# Patient Record
Sex: Female | Born: 1945 | Race: White | Hispanic: No | Smoking: Never smoker
Health system: Southern US, Community
[De-identification: ages and names within clinical notes are randomized; demographics above are authoritative.]

## PROBLEM LIST (undated history)

## (undated) DIAGNOSIS — J40 Bronchitis, not specified as acute or chronic: Secondary | ICD-10-CM

## (undated) DIAGNOSIS — J939 Pneumothorax, unspecified: Secondary | ICD-10-CM

## (undated) DIAGNOSIS — J189 Pneumonia, unspecified organism: Secondary | ICD-10-CM

## (undated) HISTORY — PX: EYE SURGERY: SHX253

---

## 2012-11-28 LAB — HM COLONOSCOPY

## 2017-11-07 DIAGNOSIS — H25011 Cortical age-related cataract, right eye: Secondary | ICD-10-CM | POA: Diagnosis not present

## 2017-11-07 DIAGNOSIS — H25043 Posterior subcapsular polar age-related cataract, bilateral: Secondary | ICD-10-CM | POA: Diagnosis not present

## 2017-11-07 DIAGNOSIS — H5213 Myopia, bilateral: Secondary | ICD-10-CM | POA: Diagnosis not present

## 2017-11-07 DIAGNOSIS — H2513 Age-related nuclear cataract, bilateral: Secondary | ICD-10-CM | POA: Diagnosis not present

## 2017-11-12 ENCOUNTER — Emergency Department (HOSPITAL_COMMUNITY): Payer: Medicare PPO

## 2017-11-12 ENCOUNTER — Emergency Department (HOSPITAL_COMMUNITY)
Admission: EM | Admit: 2017-11-12 | Discharge: 2017-11-12 | Disposition: A | Discharge status: Home / Self Care | Payer: Medicare PPO | Attending: Emergency Medicine | Admitting: Emergency Medicine

## 2017-11-12 ENCOUNTER — Encounter (HOSPITAL_COMMUNITY): Payer: Self-pay | Admitting: Emergency Medicine

## 2017-11-12 DIAGNOSIS — Z79899 Other long term (current) drug therapy: Secondary | ICD-10-CM | POA: Diagnosis not present

## 2017-11-12 DIAGNOSIS — R42 Dizziness and giddiness: Secondary | ICD-10-CM | POA: Insufficient documentation

## 2017-11-12 LAB — URINALYSIS, ROUTINE W REFLEX MICROSCOPIC
BILIRUBIN URINE: NEGATIVE
Bacteria, UA: NONE SEEN
Glucose, UA: NEGATIVE mg/dL
Ketones, ur: NEGATIVE mg/dL
Leukocytes, UA: NEGATIVE
Nitrite: NEGATIVE
PROTEIN: NEGATIVE mg/dL
SPECIFIC GRAVITY, URINE: 1.004 — AB (ref 1.005–1.030)
pH: 8 (ref 5.0–8.0)

## 2017-11-12 LAB — CBG MONITORING, ED: GLUCOSE-CAPILLARY: 104 mg/dL — AB (ref 70–99)

## 2017-11-12 LAB — BASIC METABOLIC PANEL
Anion gap: 9 (ref 5–15)
BUN: 19 mg/dL (ref 8–23)
CHLORIDE: 108 mmol/L (ref 98–111)
CO2: 22 mmol/L (ref 22–32)
CREATININE: 0.83 mg/dL (ref 0.44–1.00)
Calcium: 9.8 mg/dL (ref 8.9–10.3)
GFR calc Af Amer: >60 mL/min (ref >60)
GFR calc non Af Amer: >60 mL/min (ref >60)
GLUCOSE: 122 mg/dL — AB (ref 70–99)
Potassium: 3.6 mmol/L (ref 3.5–5.1)
SODIUM: 139 mmol/L (ref 135–145)

## 2017-11-12 LAB — CBC
HCT: 44.4 % (ref 36.0–46.0)
Hemoglobin: 15.1 g/dL — ABNORMAL HIGH (ref 12.0–15.0)
MCH: 32.2 pg (ref 26.0–34.0)
MCHC: 34 g/dL (ref 30.0–36.0)
MCV: 94.7 fL (ref 78.0–100.0)
PLATELETS: 293 10*3/uL (ref 150–400)
RBC: 4.69 MIL/uL (ref 3.87–5.11)
RDW: 13.5 % (ref 11.5–15.5)
WBC: 6.9 10*3/uL (ref 4.0–10.5)

## 2017-11-12 MED ORDER — DIAZEPAM 2 MG PO TABS
2.0000 mg | ORAL_TABLET | Freq: Once | ORAL | Status: AC
  Administered 2017-11-12: 2 mg via ORAL
  Filled 2017-11-12: qty 1

## 2017-11-12 MED ORDER — SODIUM CHLORIDE 0.9 % IV BOLUS
1000.0000 mL | Freq: Once | INTRAVENOUS | Status: AC
  Administered 2017-11-12: 1000 mL via INTRAVENOUS

## 2017-11-12 MED ORDER — MECLIZINE HCL 25 MG PO TABS
12.5000 mg | ORAL_TABLET | Freq: Once | ORAL | Status: AC
  Administered 2017-11-12: 12.5 mg via ORAL
  Filled 2017-11-12: qty 1

## 2017-11-12 MED ORDER — DIAZEPAM 2 MG PO TABS: 2.0000 mg | ORAL_TABLET | Freq: Two times a day (BID) | ORAL | 0 refills | Status: DC | PRN

## 2017-11-12 MED ORDER — MECLIZINE HCL 12.5 MG PO TABS: 12.5000 mg | ORAL_TABLET | Freq: Three times a day (TID) | ORAL | 0 refills | Status: DC | PRN

## 2017-11-12 NOTE — ED Notes (Signed)
ED Provider at bedside. 

## 2017-11-12 NOTE — ED Triage Notes (Signed)
Pt reports she is experiencing dizziness.  Pt states she feels very unsteady on her feet.  Pt reports last time she felt this way she needed 3 units of blood.  Pt reports emesis X1.  Pt took atrovent before coming to ED

## 2017-11-12 NOTE — ED Notes (Signed)
Pt ambulated throughout hallway without assistance. O2 saturation remained steady at 93%. Pt walked with slow shuffle steps, stated this was not normal for her as she normally walks quickly and several miles a day. No shortness of breath or dizziness noted.

## 2017-11-12 NOTE — Discharge Instructions (Addendum)
Take meclizine every 8 hours as needed for dizziness.  If you find this medication is not working, you can try taking Valium once every 12 hours.  Do not combine these medications and do not drive while taking them.  Do not combine them with any other sedating medications or alcohol.  You can attempt the Epley maneuver at home up to 3 times daily to help with your vertigo (see attached instructions).  Please follow-up with the ear nose and throat doctor, Dr. Annalee Genta, or neurology office for further evaluation and treatment of your vertigo.  Please follow-up and establish care with a primary care provider by calling the number circled on your discharge paperwork.  Please return to the emergency department immediately if you develop any new or worsening symptoms.

## 2017-11-12 NOTE — ED Provider Notes (Signed)
[facility] EMERGENCY DEPARTMENT Provider Note   CSN: [id number] Arrival date & time: 11/12/17  6948     History   Chief Complaint Chief Complaint  Patient presents with  . Dizziness    HPI Cynthia Simpson is a 72 y.o. female who is previously healthy presents with dizziness that began when she woke up this morning.  She reports she is feeling unsteady on her feet.  She has feels like the room is spinning and she feels like she is going to fall over.  She reports she is felt like this once or twice in the past and one time her hemoglobin was very low and she had a GI bleed.  She denies any blood noted in her stool.  She reports having emesis once this morning when she was dizzy.  She also had one loose stool.  She denies any vision changes, headache, chest pain, shortness of breath, abdominal pain, urinary symptoms.  Patient took meclizine without significant relief.  HPI  History reviewed. No pertinent past medical history.  There are no active problems to display for this patient.   History reviewed. No pertinent surgical history.   OB History   None      Home Medications    Prior to Admission medications   Medication Sig Start Date End Date Taking? Authorizing Provider  aspirin 325 MG tablet Take 325 mg by mouth every 6 (six) hours as needed for mild pain.   Yes [provider]  Cholecalciferol (VITAMIN D PO) Take 2 capsules by mouth daily.   Yes [provider]  Omega-3 Fatty Acids (OMEGA 3 PO) Take 2 capsules by mouth daily.   Yes [provider]  diazepam (VALIUM) 2 MG tablet Take 1 tablet (2 mg total) by mouth every 12 (twelve) hours as needed (dizziness). 11/12/17   Betheny Suchecki, Waylan Boga, PA-C  meclizine (ANTIVERT) 12.5 MG tablet Take 1 tablet (12.5 mg total) by mouth 3 (three) times daily as needed for dizziness. 11/12/17   Emi Holes, PA-C    Family History No family history on file.  Social History Social History    Tobacco Use  . Smoking status: Not on file  Substance Use Topics  . Alcohol use: Not on file  . Drug use: Not on file     Allergies   Morphine and related   Review of Systems Review of Systems  Constitutional: Negative for chills and fever.  HENT: Negative for facial swelling and sore throat.   Eyes: Negative for visual disturbance.  Respiratory: Negative for shortness of breath.   Cardiovascular: Negative for chest pain.  Gastrointestinal: Negative for abdominal pain, nausea and vomiting.  Genitourinary: Negative for dysuria.  Musculoskeletal: Negative for back pain.  Skin: Negative for rash and wound.  Neurological: Positive for dizziness. Negative for headaches.  Psychiatric/Behavioral: The patient is not nervous/anxious.      Physical Exam Updated Vital Signs BP 130/75   Pulse 81   Temp 97.9 F (36.6 C) (Oral)   Resp 16   Ht 5\' 6"  (1.676 m)   Wt 64.4 kg   SpO2 99%   BMI 22.92 kg/m   Physical Exam  Constitutional: She appears well-developed and well-nourished. No distress.  HENT:  Head: Normocephalic and atraumatic.  Mouth/Throat: Oropharynx is clear and moist. No oropharyngeal exudate.  Eyes: Pupils are equal, round, and reactive to light. Conjunctivae are normal. Right eye exhibits no discharge. Left eye exhibits no discharge. No scleral icterus.  Neck: Normal  range of motion. Neck supple. No thyromegaly present.  Cardiovascular: Normal rate, regular rhythm, normal heart sounds and intact distal pulses. Exam reveals no gallop and no friction rub.  No murmur heard. Pulmonary/Chest: Effort normal and breath sounds normal. No stridor. No respiratory distress. She has no wheezes. She has no rales.  Abdominal: Soft. Bowel sounds are normal. She exhibits no distension. There is no tenderness. There is no rebound and no guarding.  Musculoskeletal: She exhibits no edema.  Lymphadenopathy:    She has no cervical adenopathy.  Neurological: She is alert.  Coordination normal.  CN 3-12 intact; normal sensation throughout; 5/5 strength in all 4 extremities; equal bilateral grip strength; no ataxia on finger-to-nose; normal heel-to-shin test; patient feeling wobbly with Romberg  Skin: Skin is warm and dry. No rash noted. She is not diaphoretic. No pallor.  Psychiatric: She has a normal mood and affect.  Nursing note and vitals reviewed.    ED Treatments / Results  Labs (all labs ordered are listed, but only abnormal results are displayed) Labs Reviewed  BASIC METABOLIC PANEL - Abnormal; Notable for the following components:      Result Value   Glucose, Bld 122 (*)    All other components within normal limits  CBC - Abnormal; Notable for the following components:   Hemoglobin 15.1 (*)    All other components within normal limits  URINALYSIS, ROUTINE W REFLEX MICROSCOPIC - Abnormal; Notable for the following components:   Color, Urine STRAW (*)    Specific Gravity, Urine 1.004 (*)    Hgb urine dipstick SMALL (*)    All other components within normal limits  CBG MONITORING, ED - Abnormal; Notable for the following components:   Glucose-Capillary 104 (*)    All other components within normal limits    EKG None  Radiology Mr Brain Wo Contrast  Result Date: 11/12/2017 CLINICAL DATA:  Persistent central vertigo EXAM: MRI HEAD WITHOUT CONTRAST TECHNIQUE: Multiplanar, multiecho pulse sequences of the brain and surrounding structures were obtained without intravenous contrast. COMPARISON:  None. FINDINGS: Brain: Mild atrophy. Negative for acute infarct. Mild chronic white matter changes. Benign cyst left basal ganglia. Negative for hemorrhage mass or fluid collection. No midline shift. Vascular: Normal arterial flow voids Skull and upper cervical spine: Negative Sinuses/Orbits: Negative.  Scleral banding right globe. Other: None IMPRESSION: No acute abnormality. 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  Electronically Signed   By: Marlan Palau M.D.   On: 11/12/2017 08:39    Procedures Procedures (including critical care time)  Medications Ordered in ED Medications  sodium chloride 0.9 % bolus 1,000 mL (0 mLs Intravenous Stopped 11/12/17 0904)  diazepam (VALIUM) tablet 2 mg (2 mg Oral Given 11/12/17 0954)  meclizine (ANTIVERT) tablet 12.5 mg (12.5 mg Oral Given 11/12/17 1220)     Initial Impression / Assessment and Plan / ED Course  I have reviewed the triage vital signs and the nursing notes.  Pertinent labs & imaging results that were available during my care of the patient were reviewed by me and considered in my medical decision making (see chart for details).  Clinical Course as of Nov 13 1534  Wed Nov 12, 2017  1057 Patient MRI negative for posterior stroke.  Patient given Valium 2 mg is ambulating better, however she still feels unsteady.  Will give meclizine 12.5 mg.   [AL]    Clinical Course User Index [AL] Emi Holes, PA-C    Patient presenting with dizziness  starting this morning.  Labs are unremarkable except for mild elevation in hemoglobin, 15.1.  Patient given 1 L of fluids.  UA is negative for infection.  MRI is negative for acute abnormality.  Suspect peripheral cause.  Patient feeling improved after Valium 2 mg and meclizine in the ED.  Considering patient is unsure if meclizine she took at home helped, will discharge home with both, but patient stressed not to combine the medications and not to drive while taking either of them.  Patient also counseled on the Epley maneuver. She will be referred to ENT and neurology and follow-up and establish care with PCP.  Return precautions discussed.  Patient understands and agrees with plan.  Patient vitals stable throughout ED course and discharged in satisfactory condition. I discussed patient case with Dr. Patria Mane who guided the patient's management and agrees with plan.   Final Clinical Impressions(s) / ED Diagnoses    Final diagnoses:  Vertigo    ED Discharge Orders         Ordered    meclizine (ANTIVERT) 12.5 MG tablet  3 times daily PRN     11/12/17 1328    diazepam (VALIUM) 2 MG tablet  Every 12 hours PRN     11/12/17 [address], PA-C 11/12/17 1537    Azalia Bilis, MD 11/13/17 [phone]

## 2018-01-06 DIAGNOSIS — H2511 Age-related nuclear cataract, right eye: Secondary | ICD-10-CM | POA: Diagnosis not present

## 2018-01-06 DIAGNOSIS — H25811 Combined forms of age-related cataract, right eye: Secondary | ICD-10-CM | POA: Diagnosis not present

## 2018-01-06 DIAGNOSIS — H25041 Posterior subcapsular polar age-related cataract, right eye: Secondary | ICD-10-CM | POA: Diagnosis not present

## 2018-01-06 DIAGNOSIS — H25011 Cortical age-related cataract, right eye: Secondary | ICD-10-CM | POA: Diagnosis not present

## 2018-02-17 DIAGNOSIS — H25812 Combined forms of age-related cataract, left eye: Secondary | ICD-10-CM | POA: Diagnosis not present

## 2018-02-17 DIAGNOSIS — H25042 Posterior subcapsular polar age-related cataract, left eye: Secondary | ICD-10-CM | POA: Diagnosis not present

## 2018-02-17 DIAGNOSIS — H2512 Age-related nuclear cataract, left eye: Secondary | ICD-10-CM | POA: Diagnosis not present

## 2018-03-27 ENCOUNTER — Encounter (HOSPITAL_COMMUNITY): Payer: Self-pay

## 2018-03-27 ENCOUNTER — Ambulatory Visit (HOSPITAL_COMMUNITY)
Admission: EM | Admit: 2018-03-27 | Discharge: 2018-03-27 | Disposition: A | Discharge status: Home / Self Care | Payer: Medicare PPO | Attending: Internal Medicine | Admitting: Internal Medicine

## 2018-03-27 ENCOUNTER — Ambulatory Visit (INDEPENDENT_AMBULATORY_CARE_PROVIDER_SITE_OTHER): Payer: Medicare PPO

## 2018-03-27 DIAGNOSIS — J40 Bronchitis, not specified as acute or chronic: Secondary | ICD-10-CM

## 2018-03-27 DIAGNOSIS — J9811 Atelectasis: Secondary | ICD-10-CM

## 2018-03-27 DIAGNOSIS — R05 Cough: Secondary | ICD-10-CM

## 2018-03-27 HISTORY — DX: Pneumonia, unspecified organism: J18.9

## 2018-03-27 HISTORY — DX: Bronchitis, not specified as acute or chronic: J40

## 2018-03-27 HISTORY — DX: Pneumothorax, unspecified: J93.9

## 2018-03-27 MED ORDER — ALBUTEROL SULFATE HFA 108 (90 BASE) MCG/ACT IN AERS: 2.0000 | INHALATION_SPRAY | RESPIRATORY_TRACT | 0 refills | Status: DC | PRN

## 2018-03-27 MED ORDER — ALBUTEROL SULFATE (2.5 MG/3ML) 0.083% IN NEBU
2.5000 mg | INHALATION_SOLUTION | RESPIRATORY_TRACT | Status: AC
  Administered 2018-03-27: 2.5 mg via RESPIRATORY_TRACT

## 2018-03-27 MED ORDER — AZITHROMYCIN 250 MG PO TABS: ORAL_TABLET | ORAL | 0 refills | Status: DC

## 2018-03-27 MED ORDER — ALBUTEROL SULFATE (2.5 MG/3ML) 0.083% IN NEBU
INHALATION_SOLUTION | RESPIRATORY_TRACT | Status: AC
  Filled 2018-03-27: qty 3

## 2018-03-27 NOTE — ED Provider Notes (Signed)
[facility]    CSN: [id number] Arrival date & time: 03/27/18  [id number]     History   Chief Complaint Chief Complaint  Patient presents with  . URI    HPI [name] is a 73 y.o. female.   Onset of stuffy nose and cough x 8 days. Denies ST, HA, body aches. Has been feeling fatigued, but she does not feel bad. She has not had energy to do her usual walking and group exercises.  Felt a little more energetic yesterday am. She used to waiting to get better when she gets colds and has been trying to do that. She has been taking mucinex which cause sedation and lack of energy for days and d/c it. Then got Robitusin DM which did not help. Has been sleeping well, and cough is not keeping her awake. Cough is productive with white mucous. She has had bronchitis and pneumonia in the past and pneumothorax about 4 y ago. Denies long trips or edema. She is new to town and does not have a PCP yet.     Past Medical History:  Diagnosis Date  . Bronchitis    2-3 times in her life time  . Pneumonia    2016  . Pneumothorax    20+ years    There are no active problems to display for this patient.   Past Surgical History:  Procedure Laterality Date  . EYE SURGERY      OB History   No obstetric history on file.      Home Medications    Prior to Admission medications   Medication Sig Start Date End Date Taking? Authorizing Provider  albuterol (PROVENTIL HFA;VENTOLIN HFA) 108 (90 Base) MCG/ACT inhaler Inhale 2 puffs into the lungs every 4 (four) hours as needed for wheezing or shortness of breath (and cough x 5-7 days). 03/27/18   Rodriguez-Southworth, Nettie ElmSylvia, PA-C  aspirin 325 MG tablet Take 325 mg by mouth every 6 (six) hours as needed for mild pain.    [provider]  azithromycin (ZITHROMAX Z-PAK) 250 MG tablet TAKE 2 TODAY, THEN ONE QD X 4 DAYS 03/27/18   Rodriguez-Southworth, Nettie ElmSylvia, PA-C  Cholecalciferol (VITAMIN D PO) Take 2 capsules by mouth daily.     [provider]  diazepam (VALIUM) 2 MG tablet Take 1 tablet (2 mg total) by mouth every 12 (twelve) hours as needed (dizziness). 11/12/17   Law, Waylan BogaAlexandra M, PA-C  meclizine (ANTIVERT) 12.5 MG tablet Take 1 tablet (12.5 mg total) by mouth 3 (three) times daily as needed for dizziness. 11/12/17   Law, Waylan BogaAlexandra M, PA-C  Omega-3 Fatty Acids (OMEGA 3 PO) Take 2 capsules by mouth daily.    [provider]    Family History History reviewed. No pertinent family history.  Social History Social History   Tobacco Use  . Smoking status: Never Smoker  . Smokeless tobacco: Never Used  Substance Use Topics  . Alcohol use: Not on file  . Drug use: Not on file     Allergies   Levaquin [levofloxacin] and Morphine and related   Review of Systems Review of Systems  Constitutional: Positive for activity change, chills and fatigue. Negative for diaphoresis and fever.  HENT: Negative for congestion, ear discharge, ear pain, postnasal drip, rhinorrhea and sore throat.   Eyes: Negative for discharge.  Respiratory: Positive for cough. Negative for chest tightness, shortness of breath and wheezing.   Cardiovascular: Negative for chest pain, palpitations and leg swelling.  Gastrointestinal: Negative for  diarrhea, nausea and vomiting.  Genitourinary: Negative for difficulty urinating.  Musculoskeletal: Negative for myalgias.  Skin: Positive for rash.  Neurological: Negative for dizziness, weakness, light-headedness and headaches.  Hematological: Negative for adenopathy.    Physical Exam Triage Vital Signs ED Triage Vitals  Enc Vitals Group     BP 03/27/18 0824 135/79     Pulse Rate 03/27/18 0824 (!) 106     Resp 03/27/18 0824 16     Temp 03/27/18 0824 97.8 F (36.6 C)     Temp Source 03/27/18 0824 Oral     SpO2 03/27/18 0824 97 %     Weight --      Height --      Head Circumference --      Peak Flow --      Pain Score 03/27/18 0828 1     Pain Loc --      Pain Edu? --       Excl. in GC? --    No data found.  Updated Vital Signs BP 135/79 (BP Location: Left Arm)   Pulse (!) 106   Temp 97.8 F (36.6 C) (Oral)   Resp 16   SpO2 97%   Visual Acuity Right Eye Distance:   Left Eye Distance:   Bilateral Distance:    Right Eye Near:   Left Eye Near:    Bilateral Near:     Physical Exam Vitals signs and nursing note reviewed.  Constitutional:      General: She is not in acute distress.    Appearance: Normal appearance. She is not toxic-appearing.  Cardiovascular:     Rate and Rhythm: Normal rate and regular rhythm.     Heart sounds: No murmur.  Pulmonary:     Effort: Pulmonary effort is normal.     Breath sounds: Normal breath sounds. No wheezing, rhonchi or rales.     Comments: She coughed every time she exhaled, but this improved a  Little after the duo neb, but pulse ox went down to 89%. CXR ordered. After reviewing this and I re-checked her pulse ox and stayed around 95% Neurological:     Mental Status: She is alert.    UC Treatments / Results  Labs (all labs ordered are listed, but only abnormal results are displayed) Labs Reviewed - No data to display  EKG None  Radiology Dg Chest 2 View  Result Date: 03/27/2018 CLINICAL DATA:  Cough EXAM: CHEST - 2 VIEW COMPARISON:  None. FINDINGS: There is hyperinflation of the lungs compatible with COPD. Linear scarring or atelectasis in the lung bases. No effusions. Heart is normal size. No acute bony abnormality. IMPRESSION: COPD.  Bibasilar atelectasis or scarring. Electronically Signed   By: Charlett Nose M.D.   On: 03/27/2018 09:34   Procedures Procedures   Medications Ordered in UC Medications  albuterol (PROVENTIL) (2.5 MG/3ML) 0.083% nebulizer solution 2.5 mg (2.5 mg Nebulization Given 03/27/18 0901)    Initial Impression / Assessment and Plan / UC Course  I have reviewed the triage vital signs and the nursing notes.  Pertinent  imaging results that were available during my care of the  patient were reviewed by me and considered in my medical decision making (see chart for details). She coughed every time she exhaled, but this improved a  Little after the duo neb, but pulse ox went down to 89%. CXR ordered. After reviewing this and I re-checked her pulse ox and stayed around 95%. I decided to cover her for bronchitis  and placed her on Zpack and Albuterol inhaler. She will call [facility] office to get established with her.    Final Clinical Impressions(s) / UC Diagnoses   Final diagnoses:  Bronchitis   Discharge Instructions   None    ED Prescriptions    Medication Sig Dispense Auth. Provider   albuterol (PROVENTIL HFA;VENTOLIN HFA) 108 (90 Base) MCG/ACT inhaler Inhale 2 puffs into the lungs every 4 (four) hours as needed for wheezing or shortness of breath (and cough x 5-7 days). 1 Inhaler Rodriguez-Southworth, Leibish Mcgregor, PA-C   azithromycin (ZITHROMAX Z-PAK) 250 MG tablet TAKE 2 TODAY, THEN ONE QD X 4 DAYS 6 tablet Rodriguez-Southworth, Nettie Elm, PA-C     Controlled Substance Prescriptions [place] Controlled Substance Registry consulted?    Garey Ham, PA-C 03/27/18 2257

## 2018-03-27 NOTE — ED Triage Notes (Signed)
Pt presents with cold symptoms; nasal congestion, fatigue, and cough X 8 days.

## 2018-03-30 DIAGNOSIS — Z961 Presence of intraocular lens: Secondary | ICD-10-CM | POA: Diagnosis not present

## 2018-10-09 DIAGNOSIS — H547 Unspecified visual loss: Secondary | ICD-10-CM | POA: Diagnosis not present

## 2019-01-26 ENCOUNTER — Telehealth: Payer: Self-pay | Admitting: General Practice

## 2019-01-26 NOTE — Telephone Encounter (Signed)
Appointment scheduled.

## 2019-01-26 NOTE — Telephone Encounter (Signed)
Dr Ronnald Ramp, Would you be willing to see this patient to establish care? (see message below)  Please advise.

## 2019-01-26 NOTE — Telephone Encounter (Signed)
Yes, I will see her  TJ 

## 2019-01-26 NOTE — Telephone Encounter (Signed)
Pt called in to ask if provider Jones would take her on as a np? She said that he was suggested by 2 current patients, her son and daughter in law.   Adam and also Arta Bruce    CB: [phone]

## 2019-01-28 ENCOUNTER — Encounter: Payer: Self-pay | Admitting: Internal Medicine

## 2019-01-28 ENCOUNTER — Ambulatory Visit (INDEPENDENT_AMBULATORY_CARE_PROVIDER_SITE_OTHER): Payer: Medicare PPO | Admitting: Internal Medicine

## 2019-01-28 ENCOUNTER — Other Ambulatory Visit (INDEPENDENT_AMBULATORY_CARE_PROVIDER_SITE_OTHER): Payer: Medicare PPO

## 2019-01-28 ENCOUNTER — Ambulatory Visit (INDEPENDENT_AMBULATORY_CARE_PROVIDER_SITE_OTHER)
Admission: RE | Admit: 2019-01-28 | Discharge: 2019-01-28 | Disposition: A | Discharge status: Home / Self Care | Payer: Medicare PPO | Source: Ambulatory Visit | Attending: Internal Medicine | Admitting: Internal Medicine

## 2019-01-28 ENCOUNTER — Other Ambulatory Visit: Payer: Self-pay

## 2019-01-28 VITALS — BP 146/96 | HR 127 | Temp 98.8°F | Resp 16 | Ht 66.0 in | Wt 127.0 lb

## 2019-01-28 DIAGNOSIS — M79652 Pain in left thigh: Secondary | ICD-10-CM | POA: Diagnosis not present

## 2019-01-28 DIAGNOSIS — E538 Deficiency of other specified B group vitamins: Secondary | ICD-10-CM

## 2019-01-28 DIAGNOSIS — D539 Nutritional anemia, unspecified: Secondary | ICD-10-CM | POA: Diagnosis not present

## 2019-01-28 DIAGNOSIS — M79606 Pain in leg, unspecified: Secondary | ICD-10-CM

## 2019-01-28 DIAGNOSIS — M1711 Unilateral primary osteoarthritis, right knee: Secondary | ICD-10-CM | POA: Diagnosis not present

## 2019-01-28 DIAGNOSIS — M1712 Unilateral primary osteoarthritis, left knee: Secondary | ICD-10-CM | POA: Diagnosis not present

## 2019-01-28 DIAGNOSIS — R000 Tachycardia, unspecified: Secondary | ICD-10-CM | POA: Diagnosis not present

## 2019-01-28 DIAGNOSIS — M170 Bilateral primary osteoarthritis of knee: Secondary | ICD-10-CM

## 2019-01-28 DIAGNOSIS — I10 Essential (primary) hypertension: Secondary | ICD-10-CM

## 2019-01-28 DIAGNOSIS — R739 Hyperglycemia, unspecified: Secondary | ICD-10-CM | POA: Diagnosis not present

## 2019-01-28 DIAGNOSIS — G63 Polyneuropathy in diseases classified elsewhere: Secondary | ICD-10-CM | POA: Diagnosis not present

## 2019-01-28 LAB — HEPATIC FUNCTION PANEL
ALT: 14 U/L (ref 0–35)
AST: 15 U/L (ref 0–37)
Albumin: 4.4 g/dL (ref 3.5–5.2)
Alkaline Phosphatase: 65 U/L (ref 39–117)
Bilirubin, Direct: 0.1 mg/dL (ref 0.0–0.3)
Total Bilirubin: 0.6 mg/dL (ref 0.2–1.2)
Total Protein: 7.7 g/dL (ref 6.0–8.3)

## 2019-01-28 LAB — BASIC METABOLIC PANEL
BUN: 18 mg/dL (ref 6–23)
CO2: 26 mEq/L (ref 19–32)
Calcium: 9.7 mg/dL (ref 8.4–10.5)
Chloride: 102 mEq/L (ref 96–112)
Creatinine, Ser: 0.75 mg/dL (ref 0.40–1.20)
GFR: 75.29 mL/min (ref >60.00)
Glucose, Bld: 105 mg/dL — ABNORMAL HIGH (ref 70–99)
Potassium: 3.5 mEq/L (ref 3.5–5.1)
Sodium: 138 mEq/L (ref 135–145)

## 2019-01-28 LAB — IBC PANEL
Iron: 65 ug/dL (ref 42–145)
Saturation Ratios: 16 % — ABNORMAL LOW (ref 20.0–50.0)
Transferrin: 290 mg/dL (ref 212.0–360.0)

## 2019-01-28 LAB — CBC WITH DIFFERENTIAL/PLATELET
Basophils Absolute: 0.1 10*3/uL (ref 0.0–0.1)
Basophils Relative: 0.9 % (ref 0.0–3.0)
Eosinophils Absolute: 0.1 10*3/uL (ref 0.0–0.7)
Eosinophils Relative: 0.9 % (ref 0.0–5.0)
HCT: 42.7 % (ref 36.0–46.0)
Hemoglobin: 14.2 g/dL (ref 12.0–15.0)
Lymphocytes Relative: 21.7 % (ref 12.0–46.0)
Lymphs Abs: 2.4 10*3/uL (ref 0.7–4.0)
MCHC: 33.3 g/dL (ref 30.0–36.0)
MCV: 96.7 fl (ref 78.0–100.0)
Monocytes Absolute: 0.8 10*3/uL (ref 0.1–1.0)
Monocytes Relative: 7.4 % (ref 3.0–12.0)
Neutro Abs: 7.7 10*3/uL (ref 1.4–7.7)
Neutrophils Relative %: 69.1 % (ref 43.0–77.0)
Platelets: 288 10*3/uL (ref 150.0–400.0)
RBC: 4.41 Mil/uL (ref 3.87–5.11)
RDW: 13.6 % (ref 11.5–15.5)
WBC: 11.1 10*3/uL — ABNORMAL HIGH (ref 4.0–10.5)

## 2019-01-28 LAB — HEMOGLOBIN A1C: Hgb A1c MFr Bld: 5.4 % (ref 4.6–6.5)

## 2019-01-28 LAB — CK: Total CK: 55 U/L (ref 7–177)

## 2019-01-28 LAB — SEDIMENTATION RATE: Sed Rate: 13 mm/hr (ref 0–30)

## 2019-01-28 MED ORDER — TRAMADOL HCL 50 MG PO TABS: 50.0000 mg | ORAL_TABLET | Freq: Four times a day (QID) | ORAL | 1 refills | Status: AC | PRN

## 2019-01-28 NOTE — Patient Instructions (Signed)

## 2019-01-28 NOTE — Progress Notes (Signed)
Subjective:  Patient ID: Cynthia Simpson, female    DOB: 12-28-1945  Age: 73 y.o. MRN: [id number]  CC: Osteoarthritis   NEW TO ME  This visit occurred during the SARS-CoV-2 public health emergency.  Safety protocols were in place, including screening questions prior to the visit, additional usage of staff PPE, and extensive cleaning of exam room while observing appropriate contact time as indicated for disinfecting solutions.    HPI Cynthia Simpson presents for concerns about her lower extremities.  She tells me up until a week ago she was walking long distances each day.  She tells me over the last week she has developed stabbing pain diffusely in both lower extremities.  She says the pain is in the joints as well as the bones.  She has started using a 4 pronged walker because of the pain.  She denies claudication.  She denies trauma or injury.  She has tried to control the pain with Valium and aspirin to no avail.  She says her legs feel weak but she thinks this is caused by the pain.  She denies numbness or tingling in her lower extremities or feet.  She tells me that about a year and a half ago when she lived in [place] she was admitted for symptomatic anemia.  She tells me she had to receive a transfusion.  Outpatient Medications Prior to Visit  Medication Sig Dispense Refill   albuterol (PROVENTIL HFA;VENTOLIN HFA) 108 (90 Base) MCG/ACT inhaler Inhale 2 puffs into the lungs every 4 (four) hours as needed for wheezing or shortness of breath (and cough x 5-7 days). 1 Inhaler 0   Cholecalciferol (VITAMIN D PO) Take 2 capsules by mouth daily.     diazepam (VALIUM) 2 MG tablet Take 1 tablet (2 mg total) by mouth every 12 (twelve) hours as needed (dizziness). 5 tablet 0   Omega-3 Fatty Acids (OMEGA 3 PO) Take 2 capsules by mouth daily.     zinc gluconate 50 MG tablet Take 50 mg by mouth daily.     aspirin 325 MG tablet Take 325 mg by mouth every 6 (six) hours as needed for  mild pain.     azithromycin (ZITHROMAX Z-PAK) 250 MG tablet TAKE 2 TODAY, THEN ONE QD X 4 DAYS 6 tablet 0   meclizine (ANTIVERT) 12.5 MG tablet Take 1 tablet (12.5 mg total) by mouth 3 (three) times daily as needed for dizziness. 30 tablet 0   No facility-administered medications prior to visit.     ROS Review of Systems  Constitutional: Negative.  Negative for diaphoresis, fatigue and unexpected weight change.  HENT: Negative.   Eyes: Negative for visual disturbance.  Respiratory: Negative for cough, chest tightness, shortness of breath and wheezing.   Cardiovascular: Negative for chest pain, palpitations and leg swelling.  Gastrointestinal: Negative for abdominal pain, constipation, diarrhea, nausea and vomiting.  Endocrine: Negative.   Genitourinary: Negative.  Negative for difficulty urinating.  Musculoskeletal: Positive for arthralgias and gait problem. Negative for back pain and myalgias.  Skin: Negative for color change and pallor.  Neurological: Positive for weakness. Negative for dizziness, tremors, light-headedness, numbness and headaches.  Hematological: Negative for adenopathy. Does not bruise/bleed easily.  Psychiatric/Behavioral: Negative for behavioral problems. The patient is nervous/anxious.     Objective:  BP (!) 146/96 (BP Location: Left Arm, Patient Position: Sitting, Cuff Size: Normal)    Pulse (!) 127    Temp 98.8 F (37.1 C) (Oral)    Resp 16  Ht 5\' 6"  (1.676 m)    Wt 127 lb (57.6 kg)    SpO2 99%    BMI 20.50 kg/m   BP Readings from Last 3 Encounters:  01/28/19 (!) 146/96  03/27/18 135/79  11/12/17 130/75    Wt Readings from Last 3 Encounters:  01/28/19 127 lb (57.6 kg)  11/12/17 142 lb (64.4 kg)    Physical Exam Vitals signs reviewed.  HENT:     Nose: Nose normal.     Mouth/Throat:     Mouth: Mucous membranes are moist.  Eyes:     General: No scleral icterus.    Conjunctiva/sclera: Conjunctivae normal.  Neck:     Musculoskeletal: Neck  supple. No muscular tenderness.  Cardiovascular:     Rate and Rhythm: Regular rhythm. Tachycardia present.     Pulses:          Carotid pulses are 1+ on the right side and 1+ on the left side.      Radial pulses are 1+ on the right side and 1+ on the left side.       Femoral pulses are 1+ on the right side and 1+ on the left side.      Popliteal pulses are 1+ on the right side and 1+ on the left side.       Dorsalis pedis pulses are 1+ on the right side and 1+ on the left side.       Posterior tibial pulses are 1+ on the right side and 1+ on the left side.     Heart sounds: No murmur.     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   ABNORMAL  Pulmonary:     Effort: Pulmonary effort is normal.     Breath sounds: No stridor. No wheezing, rhonchi or rales.  Abdominal:     General: Abdomen is flat. Bowel sounds are normal. There is no distension.     Palpations: Abdomen is soft. There is no hepatomegaly, splenomegaly or mass.     Tenderness: There is no abdominal tenderness.  Musculoskeletal:     Right knee: She exhibits deformity (DJD). She exhibits normal range of motion, no swelling, no effusion, no erythema, normal alignment and no bony tenderness. No tenderness found.     Left knee: She exhibits deformity (DJD). She exhibits normal range of motion, no effusion, no erythema, normal alignment and no bony tenderness. No tenderness found.     Right foot: Normal range of motion.     Left foot: Normal range of motion.  Feet:     Right foot:     Skin integrity: No ulcer or skin breakdown.     Left foot:     Skin integrity: No ulcer or skin breakdown.  Lymphadenopathy:     Cervical: No cervical adenopathy.  Skin:    General: Skin is warm and dry.     Coloration: Skin is not pale.  Neurological:     Mental Status: She is alert.     Cranial Nerves: Cranial nerves are intact. No cranial nerve deficit or dysarthria.     Motor: Weakness present.     Coordination:  Coordination abnormal.     Deep Tendon Reflexes: Reflexes normal.     Reflex Scores:      Tricep reflexes are 0 on the right side and 0 on the left side.      Bicep reflexes are 0 on the right side and 0 on the left side.  Brachioradialis reflexes are 0 on the right side and 0 on the left side.      Patellar reflexes are 0 on the right side and 0 on the left side.      Achilles reflexes are 0 on the right side and 0 on the left side. Psychiatric:        Attention and Perception: Perception normal. She is inattentive.        Mood and Affect: Mood is anxious.        Speech: Speech is tangential. Speech is not rapid and pressured.        Behavior: Behavior normal. Behavior is cooperative.        Thought Content: Thought content normal.        Cognition and Memory: Cognition normal.        Judgment: Judgment normal.     Lab Results  Component Value Date   WBC 11.1 (H) 01/28/2019   HGB 14.2 01/28/2019   HCT 42.7 01/28/2019   PLT 288.0 01/28/2019   GLUCOSE 105 (H) 01/28/2019   ALT 14 01/28/2019   AST 15 01/28/2019   NA 138 01/28/2019   K 3.5 01/28/2019   CL 102 01/28/2019   CREATININE 0.75 01/28/2019   BUN 18 01/28/2019   CO2 26 01/28/2019   TSH 6.17 (H) 01/28/2019   HGBA1C 5.4 01/28/2019    Dg Chest 2 View  Result Date: 03/27/2018 CLINICAL DATA:  Cough EXAM: CHEST - 2 VIEW COMPARISON:  None. FINDINGS: There is hyperinflation of the lungs compatible with COPD. Linear scarring or atelectasis in the lung bases. No effusions. Heart is normal size. No acute bony abnormality. IMPRESSION: COPD.  Bibasilar atelectasis or scarring. Electronically Signed   By: Charlett Nose M.D.   On: 03/27/2018 09:34   Dg Tibia/fibula Left  Result Date: 01/28/2019 CLINICAL DATA:  Left leg pain.  No known injury. EXAM: LEFT TIBIA AND FIBULA - 2 VIEW COMPARISON:  None. FINDINGS: Slight joint space narrowing within the left knee joint. No acute bony abnormality. Specifically, no fracture, subluxation,  or dislocation. No joint effusion within the left knee. Soft tissues are intact. IMPRESSION: No acute bony abnormality. Electronically Signed   By: Charlett Nose M.D.   On: 01/28/2019 23:03   Dg Tibia/fibula Right  Result Date: 01/28/2019 CLINICAL DATA:  Right leg pain.  No known injury. EXAM: RIGHT TIBIA AND FIBULA - 2 VIEW COMPARISON:  None FINDINGS: Slight joint space narrowing within the right knee joint. No joint effusion. No acute bony abnormality. Specifically, no fracture, subluxation, or dislocation. Soft tissues intact. IMPRESSION: No acute bony abnormality. Electronically Signed   By: Charlett Nose M.D.   On: 01/28/2019 23:03   Dg Femur Min 2 Views Left  Result Date: 01/28/2019 CLINICAL DATA:  Left upper leg pain.  No known injury. EXAM: LEFT FEMUR 2 VIEWS COMPARISON:  None. FINDINGS: No acute bony abnormality. Specifically, no fracture, subluxation, or dislocation. Mild joint space narrowing within the left knee joint. No joint effusion. IMPRESSION: No acute bony abnormality. Mild degenerative changes in the left knee. Electronically Signed   By: Charlett Nose M.D.   On: 01/28/2019 23:02   Dg Femur, Min 2 Views Right  Result Date: 01/28/2019 CLINICAL DATA:  Right upper leg pain.  No known injury. EXAM: RIGHT FEMUR 2 VIEWS COMPARISON:  None. FINDINGS: No acute bony abnormality. Specifically, no fracture, subluxation, or dislocation. Joint space narrowing within the knee joint. No visible joint effusion. IMPRESSION: No acute bony abnormality. Mild  degenerative changes in the right knee joint. Electronically Signed   By: Charlett NoseKevin  Dover M.D.   On: 01/28/2019 23:02    Assessment & Plan:   Gardiner RamusLillian was seen today for osteoarthritis.  Diagnoses and all orders for this visit:  Tachycardia- She has mild asymptomatic tachycardia.  Her labs are negative for secondary causes.  This is likely caused by anxiety. -     EKG 12-Lead -     TSH; Future  Essential hypertension- Will discuss treatment  options during follow-up visits. -     EKG 12-Lead -     CBC with Differential; Future -     Basic metabolic panel; Future -     Hepatic function panel; Future -     Vitamin D 25 hydroxy; Future  Leg pain, diffuse, unspecified laterality- Based on her symptoms, exam, and plain films I think her symptoms are related to osteoarthritis.  I recommended that she try tramadol as needed for this. -     Sedimentation rate; Future -     CK (Creatine Kinase); Future -     DG FEMUR, MIN 2 VIEWS RIGHT; Future -     DG FEMUR MIN 2 VIEWS LEFT; Future -     DG Tibia/Fibula Left; Future -     DG Tibia/Fibula Right; Future  Deficiency anemia- Her H&H are normal now but she is deficient in vitamin B12. -     CBC with Differential; Future -     B12; Future -     IBC panel; Future -     Folate; Future -     Ferritin; Future -     Vitamin B1; Future  Hyperglycemia -     Hemoglobin A1c; Future  Primary osteoarthritis of both knees -     traMADol (ULTRAM) 50 MG tablet; Take 1 tablet (50 mg total) by mouth every 6 (six) hours as needed for up to 7 days for moderate pain.  Vitamin B12 deficiency neuropathy (HCC)- I recommended that she start parenteral B12 replacement therapy.   I have discontinued Gardiner RamusLillian T. Cai's aspirin, meclizine, and azithromycin. I am also having her start on traMADol. Additionally, I am having her maintain her Omega-3 Fatty Acids (OMEGA 3 PO), Cholecalciferol (VITAMIN D PO), diazepam, albuterol, and zinc gluconate.  Meds ordered this encounter  Medications   traMADol (ULTRAM) 50 MG tablet    Sig: Take 1 tablet (50 mg total) by mouth every 6 (six) hours as needed for up to 7 days for moderate pain.    Dispense:  90 tablet    Refill:  1     Follow-up: Return today (on 01/28/2019).  Sanda Lingerhomas Gudrun Axe, MD

## 2019-01-29 DIAGNOSIS — E538 Deficiency of other specified B group vitamins: Secondary | ICD-10-CM | POA: Insufficient documentation

## 2019-01-29 LAB — FERRITIN: Ferritin: 105.9 ng/mL (ref 10.0–291.0)

## 2019-01-29 LAB — VITAMIN B12: Vitamin B-12: 175 pg/mL — ABNORMAL LOW (ref 211–911)

## 2019-01-29 LAB — FOLATE: Folate: 12 ng/mL (ref >5.9)

## 2019-01-29 LAB — TSH: TSH: 6.17 u[IU]/mL — ABNORMAL HIGH (ref 0.35–4.50)

## 2019-01-29 LAB — VITAMIN D 25 HYDROXY (VIT D DEFICIENCY, FRACTURES): VITD: 86.93 ng/mL (ref 30.00–100.00)

## 2019-01-31 LAB — VITAMIN B1: Vitamin B1 (Thiamine): 13 nmol/L (ref 8–30)

## 2019-02-01 ENCOUNTER — Other Ambulatory Visit: Payer: Self-pay | Admitting: Internal Medicine

## 2019-02-01 ENCOUNTER — Telehealth: Payer: Self-pay | Admitting: Internal Medicine

## 2019-02-01 DIAGNOSIS — M170 Bilateral primary osteoarthritis of knee: Secondary | ICD-10-CM

## 2019-02-01 MED ORDER — MELOXICAM 7.5 MG PO TABS: 7.5000 mg | ORAL_TABLET | Freq: Every day | ORAL | 0 refills | Status: DC

## 2019-02-01 NOTE — Telephone Encounter (Signed)
I will send a referral to ortho and prescribe an nsaid  TJ

## 2019-02-01 NOTE — Telephone Encounter (Signed)
Pt contacted and informed of same. Pt stated understanding and will pick up the meloxicam. Pt stated that Ortho has already called to schedule.

## 2019-02-01 NOTE — Telephone Encounter (Signed)
Pt was seen Friday for her leg pain and was given tramadol.  She is a little better when sitting, but it is in a lot of pain when she stands up.  She wants to know what else she can do for it.  She also is asking whether her B12 deficiency has anything to do with the leg pain.  Please call [phone].  She is asking for Dr Ronnald Ramp specifically to call her back

## 2019-02-01 NOTE — Telephone Encounter (Signed)
Called patient: Moving and walking is still hurting and pt wanted to know if there was something else that she can do to help with the pain; ie: anti inflammatory medication or specialist.

## 2019-02-03 ENCOUNTER — Ambulatory Visit (INDEPENDENT_AMBULATORY_CARE_PROVIDER_SITE_OTHER): Payer: Medicare PPO

## 2019-02-03 ENCOUNTER — Other Ambulatory Visit: Payer: Self-pay

## 2019-02-03 DIAGNOSIS — E538 Deficiency of other specified B group vitamins: Secondary | ICD-10-CM

## 2019-02-03 MED ORDER — CYANOCOBALAMIN 1000 MCG/ML IJ SOLN
1000.0000 ug | Freq: Once | INTRAMUSCULAR | Status: AC
  Administered 2019-02-03: 1000 ug via INTRAMUSCULAR

## 2019-02-03 NOTE — Progress Notes (Signed)
I have reviewed and agree.

## 2019-02-08 ENCOUNTER — Ambulatory Visit (INDEPENDENT_AMBULATORY_CARE_PROVIDER_SITE_OTHER): Payer: Medicare PPO | Admitting: Internal Medicine

## 2019-02-08 ENCOUNTER — Encounter: Payer: Self-pay | Admitting: Internal Medicine

## 2019-02-08 ENCOUNTER — Other Ambulatory Visit: Payer: Self-pay

## 2019-02-08 VITALS — BP 140/88 | HR 90 | Temp 97.8°F | Resp 16 | Ht 66.0 in | Wt 123.8 lb

## 2019-02-08 DIAGNOSIS — Z23 Encounter for immunization: Secondary | ICD-10-CM

## 2019-02-08 DIAGNOSIS — M170 Bilateral primary osteoarthritis of knee: Secondary | ICD-10-CM

## 2019-02-08 DIAGNOSIS — G63 Polyneuropathy in diseases classified elsewhere: Secondary | ICD-10-CM

## 2019-02-08 DIAGNOSIS — E538 Deficiency of other specified B group vitamins: Secondary | ICD-10-CM | POA: Diagnosis not present

## 2019-02-08 DIAGNOSIS — I10 Essential (primary) hypertension: Secondary | ICD-10-CM | POA: Diagnosis not present

## 2019-02-08 MED ORDER — CYANOCOBALAMIN 1000 MCG/ML IJ SOLN
1000.0000 ug | Freq: Once | INTRAMUSCULAR | Status: AC
  Administered 2019-02-08: 09:00:00 1000 ug via INTRAMUSCULAR

## 2019-02-08 MED ORDER — SHINGRIX 50 MCG/0.5ML IM SUSR: 0.5000 mL | Freq: Once | INTRAMUSCULAR | 1 refills | Status: AC

## 2019-02-08 NOTE — Progress Notes (Signed)
Subjective:  Patient ID: Cynthia Simpson, female    DOB: 12/13/1945  Age: 73 y.o. MRN: [id number]  CC: Osteoarthritis  This visit occurred during the SARS-CoV-2 public health emergency.  Safety protocols were in place, including screening questions prior to the visit, additional usage of staff PPE, and extensive cleaning of exam room while observing appropriate contact time as indicated for disinfecting solutions.    HPI Cynthia Simpson presents for f/up - She tells me that her lower extremity pain is better with the combination of tramadol and meloxicam.  She continues to complain that her legs feel wobbly and she has tingling in her lower extremities.  Her recent labs were remarkable for B12 deficiency.  Outpatient Medications Prior to Visit  Medication Sig Dispense Refill  . albuterol (PROVENTIL HFA;VENTOLIN HFA) 108 (90 Base) MCG/ACT inhaler Inhale 2 puffs into the lungs every 4 (four) hours as needed for wheezing or shortness of breath (and cough x 5-7 days). 1 Inhaler 0  . Cholecalciferol (VITAMIN D PO) Take 2 capsules by mouth daily.    . diazepam (VALIUM) 2 MG tablet Take 1 tablet (2 mg total) by mouth every 12 (twelve) hours as needed (dizziness). 5 tablet 0  . meloxicam (MOBIC) 7.5 MG tablet Take 1 tablet (7.5 mg total) by mouth daily. 90 tablet 0  . Omega-3 Fatty Acids (OMEGA 3 PO) Take 2 capsules by mouth daily.    Marland Kitchen. zinc gluconate 50 MG tablet Take 50 mg by mouth daily.     No facility-administered medications prior to visit.     ROS Review of Systems  Constitutional: Negative.  Negative for diaphoresis and fatigue.  HENT: Negative.   Respiratory: Negative for cough, chest tightness, shortness of breath and wheezing.   Cardiovascular: Negative for chest pain, palpitations and leg swelling.  Gastrointestinal: Negative for abdominal pain, diarrhea, nausea and vomiting.  Endocrine: Negative.   Genitourinary: Negative.  Negative for difficulty urinating.   Musculoskeletal: Positive for arthralgias. Negative for back pain and myalgias.  Neurological: Negative for dizziness, weakness and numbness.  Hematological: Negative for adenopathy. Does not bruise/bleed easily.  Psychiatric/Behavioral: Negative.     Objective:  BP 140/88 (BP Location: Left Arm, Patient Position: Sitting, Cuff Size: Normal)   Pulse 90   Temp 97.8 F (36.6 C) (Oral)   Resp 16   Ht 5\' 6"  (1.676 m)   Wt 123 lb 12 oz (56.1 kg)   SpO2 98%   BMI 19.97 kg/m   BP Readings from Last 3 Encounters:  02/08/19 140/88  01/28/19 (!) 146/96  03/27/18 135/79    Wt Readings from Last 3 Encounters:  02/08/19 123 lb 12 oz (56.1 kg)  01/28/19 127 lb (57.6 kg)  11/12/17 142 lb (64.4 kg)    Physical Exam Vitals signs reviewed.  Constitutional:      Appearance: Normal appearance.  HENT:     Nose: Nose normal.     Mouth/Throat:     Mouth: Mucous membranes are moist.  Eyes:     General: No scleral icterus.    Conjunctiva/sclera: Conjunctivae normal.  Neck:     Musculoskeletal: Neck supple.  Cardiovascular:     Rate and Rhythm: Normal rate and regular rhythm.     Heart sounds: No murmur.  Pulmonary:     Effort: Pulmonary effort is normal.     Breath sounds: No stridor. No wheezing, rhonchi or rales.  Abdominal:     General: Abdomen is flat. Bowel sounds are normal. There is no  distension.     Palpations: Abdomen is soft. There is no hepatomegaly or splenomegaly.     Tenderness: There is no abdominal tenderness.  Musculoskeletal: Normal range of motion.     Right lower leg: No edema.     Left lower leg: No edema.  Lymphadenopathy:     Cervical: No cervical adenopathy.  Skin:    General: Skin is warm and dry.  Neurological:     General: No focal deficit present.     Mental Status: She is alert.  Psychiatric:        Mood and Affect: Mood normal.        Behavior: Behavior normal.     Lab Results  Component Value Date   WBC 11.1 (H) 01/28/2019   HGB 14.2  01/28/2019   HCT 42.7 01/28/2019   PLT 288.0 01/28/2019   GLUCOSE 105 (H) 01/28/2019   ALT 14 01/28/2019   AST 15 01/28/2019   NA 138 01/28/2019   K 3.5 01/28/2019   CL 102 01/28/2019   CREATININE 0.75 01/28/2019   BUN 18 01/28/2019   CO2 26 01/28/2019   TSH 6.17 (H) 01/28/2019   HGBA1C 5.4 01/28/2019    Dg Tibia/fibula Left  Result Date: 01/28/2019 CLINICAL DATA:  Left leg pain.  No known injury. EXAM: LEFT TIBIA AND FIBULA - 2 VIEW COMPARISON:  None. FINDINGS: Slight joint space narrowing within the left knee joint. No acute bony abnormality. Specifically, no fracture, subluxation, or dislocation. No joint effusion within the left knee. Soft tissues are intact. IMPRESSION: No acute bony abnormality. Electronically Signed   By: Charlett Nose M.D.   On: 01/28/2019 23:03   Dg Tibia/fibula Right  Result Date: 01/28/2019 CLINICAL DATA:  Right leg pain.  No known injury. EXAM: RIGHT TIBIA AND FIBULA - 2 VIEW COMPARISON:  None FINDINGS: Slight joint space narrowing within the right knee joint. No joint effusion. No acute bony abnormality. Specifically, no fracture, subluxation, or dislocation. Soft tissues intact. IMPRESSION: No acute bony abnormality. Electronically Signed   By: Charlett Nose M.D.   On: 01/28/2019 23:03   Dg Femur Min 2 Views Left  Result Date: 01/28/2019 CLINICAL DATA:  Left upper leg pain.  No known injury. EXAM: LEFT FEMUR 2 VIEWS COMPARISON:  None. FINDINGS: No acute bony abnormality. Specifically, no fracture, subluxation, or dislocation. Mild joint space narrowing within the left knee joint. No joint effusion. IMPRESSION: No acute bony abnormality. Mild degenerative changes in the left knee. Electronically Signed   By: Charlett Nose M.D.   On: 01/28/2019 23:02   Dg Femur, Min 2 Views Right  Result Date: 01/28/2019 CLINICAL DATA:  Right upper leg pain.  No known injury. EXAM: RIGHT FEMUR 2 VIEWS COMPARISON:  None. FINDINGS: No acute bony abnormality. Specifically, no  fracture, subluxation, or dislocation. Joint space narrowing within the knee joint. No visible joint effusion. IMPRESSION: No acute bony abnormality. Mild degenerative changes in the right knee joint. Electronically Signed   By: Charlett Nose M.D.   On: 01/28/2019 23:02    Assessment & Plan:   Cynthia Simpson was seen today for osteoarthritis.  Diagnoses and all orders for this visit:  Vitamin B12 deficiency neuropathy (HCC)- She will continue parenteral B12 replacement therapy. -     cyanocobalamin ((VITAMIN B-12)) injection 1,000 mcg  Primary osteoarthritis of both knees- She has had some improvement with the current meds.  She tells me she is seeing orthopedics within the next week or 2.  I have also recommended  that she try some physical therapy for this. -     Ambulatory referral to Physical Therapy  Essential hypertension- Her blood pressure is adequately well controlled.  Need for diphtheria-tetanus-pertussis (Tdap) vaccine -     Tdap vaccine greater than or equal to 7yo IM  Need for pneumococcal vaccination -     Pneumococcal polysaccharide vaccine 23-valent greater than or equal to 2yo subcutaneous/IM   I am having Judeth Porch T. Aure maintain her Omega-3 Fatty Acids (OMEGA 3 PO), Cholecalciferol (VITAMIN D PO), diazepam, albuterol, zinc gluconate, and meloxicam. We administered cyanocobalamin.  Meds ordered this encounter  Medications  . cyanocobalamin ((VITAMIN B-12)) injection 1,000 mcg     Follow-up: Return in about 4 months (around 06/08/2019).  Scarlette Calico, MD

## 2019-02-08 NOTE — Patient Instructions (Signed)
Vitamin B12 Deficiency Vitamin B12 deficiency occurs when the body does not have enough vitamin B12, which is an important vitamin. The body needs this vitamin:  To make red blood cells.  To make DNA. This is the genetic material inside cells.  To help the nerves work properly so they can carry messages from the brain to the body. Vitamin B12 deficiency can cause various health problems, such as a low red blood cell count (anemia) or nerve damage. What are the causes? This condition may be caused by:  Not eating enough foods that contain vitamin B12.  Not having enough stomach acid and digestive fluids to properly absorb vitamin B12 from the food that you eat.  Certain digestive system diseases that make it hard to absorb vitamin B12. These diseases include Crohn's disease, chronic pancreatitis, and cystic fibrosis.  A condition in which the body does not make enough of a protein (intrinsic factor), resulting in too few red blood cells (pernicious anemia).  Having a surgery in which part of the stomach or small intestine is removed.  Taking certain medicines that make it hard for the body to absorb vitamin B12. These medicines include: ? Heartburn medicines (antacids and proton pump inhibitors). ? Certain antibiotic medicines. ? Some medicines that are used to treat diabetes, tuberculosis, gout, or high cholesterol. What increases the risk? The following factors may make you more likely to develop a B12 deficiency:  Being older than age 50.  Eating a vegetarian or vegan diet, especially while you are pregnant.  Eating a poor diet while you are pregnant.  Taking certain medicines.  Having alcoholism. What are the signs or symptoms? In some cases, there are no symptoms of this condition. If the condition leads to anemia or nerve damage, various symptoms can occur, such as:  Weakness.  Fatigue.  Loss of appetite.  Weight loss.  Numbness or tingling in your hands and  feet.  Redness and burning of the tongue.  Confusion or memory problems.  Depression.  Sensory problems, such as color blindness, ringing in the ears, or loss of taste.  Diarrhea or constipation.  Trouble walking. If anemia is severe, symptoms can include:  Shortness of breath.  Dizziness.  Rapid heart rate (tachycardia). How is this diagnosed? This condition may be diagnosed with a blood test to measure the level of vitamin B12 in your blood. You may also have other tests, including:  A group of tests that measure certain characteristics of blood cells (complete blood count, CBC).  A blood test to measure intrinsic factor.  A procedure where a thin tube with a camera on the end is used to look into your stomach or intestines (endoscopy). Other tests may be needed to discover the cause of B12 deficiency. How is this treated? Treatment for this condition depends on the cause. This condition may be treated by:  Changing your eating and drinking habits, such as: ? Eating more foods that contain vitamin B12. ? Drinking less alcohol or no alcohol.  Getting vitamin B12 injections.  Taking vitamin B12 supplements. Your health care provider will tell you which dosage is best for you. Follow these instructions at home: Eating and drinking   Eat lots of healthy foods that contain vitamin B12, including: ? Meats and poultry. This includes beef, pork, chicken, turkey, and organ meats, such as liver. ? Seafood. This includes clams, rainbow trout, salmon, tuna, and haddock. ? Eggs. ? Cereal and dairy products that are fortified. This means that vitamin B12   has been added to the food. Check the label on the package to see if the food is fortified. The items listed above may not be a complete list of recommended foods and beverages. Contact a dietitian for more information. General instructions  Get any injections that are prescribed by your health care provider.  Take  supplements only as told by your health care provider. Follow the directions carefully.  Do not drink alcohol if your health care provider tells you not to. In some cases, you may only be asked to limit alcohol use.  Keep all follow-up visits as told by your health care provider. This is important. Contact a health care provider if:  Your symptoms come back. Get help right away if you:  Develop shortness of breath.  Have a rapid heart rate.  Have chest pain.  Become dizzy or lose consciousness. Summary  Vitamin B12 deficiency occurs when the body does not have enough vitamin B12.  The main causes of vitamin B12 deficiency include dietary deficiency, digestive diseases, pernicious anemia, and having a surgery in which part of the stomach or small intestine is removed.  In some cases, there are no symptoms of this condition. If the condition leads to anemia or nerve damage, various symptoms can occur, such as weakness, shortness of breath, and numbness.  Treatment may include getting vitamin B12 injections or taking vitamin B12 supplements. Eat lots of healthy foods that contain vitamin B12. This information is not intended to replace advice given to you by your health care provider. Make sure you discuss any questions you have with your health care provider. Document Released: 05/20/2011 Document Revised: 11/04/2017 Document Reviewed: 11/04/2017 Elsevier Patient Education  2020 [employer]  

## 2019-02-08 NOTE — Addendum Note (Signed)
Addended by: Janith Lima on: 02/08/2019 01:40 PM   Modules accepted: Orders

## 2019-02-09 ENCOUNTER — Ambulatory Visit: Payer: Medicare PPO | Admitting: Orthopaedic Surgery

## 2019-02-10 ENCOUNTER — Ambulatory Visit: Payer: Medicare PPO | Admitting: Internal Medicine

## 2019-02-15 ENCOUNTER — Other Ambulatory Visit: Payer: Self-pay

## 2019-02-15 ENCOUNTER — Telehealth: Payer: Self-pay

## 2019-02-15 ENCOUNTER — Ambulatory Visit (INDEPENDENT_AMBULATORY_CARE_PROVIDER_SITE_OTHER): Payer: Medicare PPO

## 2019-02-15 DIAGNOSIS — E538 Deficiency of other specified B group vitamins: Secondary | ICD-10-CM | POA: Diagnosis not present

## 2019-02-15 MED ORDER — CYANOCOBALAMIN 1000 MCG/ML IJ SOLN
1000.0000 ug | Freq: Once | INTRAMUSCULAR | Status: AC
  Administered 2019-02-15: 12:00:00 1000 ug via INTRAMUSCULAR

## 2019-02-15 NOTE — Telephone Encounter (Signed)
Patient has been on meloxicam for appx 2 weeks, and it is helping, she is better so far----the worst is first thing in the morning and then after she takes meloxicam, it is better through out the day----patient is wanting to know if she should take the full 90 day supply no matter how she is feeling, or should she stop taking it at some point if she feels totally better---patient is also not taking tramadol----she has a follow up appt with you in march, but will run out of meloxicam a little before that appt---in case you were re-evaluating the need for that medicine at that follow up visit-----please advise if you would like patient to take this medicine non stop or how should she manage it?  I will call patient back, thanks

## 2019-02-15 NOTE — Telephone Encounter (Signed)
I think she should take it every day for the next few months  TJ

## 2019-02-15 NOTE — Telephone Encounter (Signed)
Patient advised of dr Ronnald Ramp note/instructions

## 2019-02-15 NOTE — Progress Notes (Signed)
I have reviewed and agree.

## 2019-02-16 ENCOUNTER — Encounter: Payer: Self-pay | Admitting: Orthopaedic Surgery

## 2019-02-16 ENCOUNTER — Ambulatory Visit: Payer: Medicare PPO | Admitting: Orthopaedic Surgery

## 2019-02-16 DIAGNOSIS — M79606 Pain in leg, unspecified: Secondary | ICD-10-CM | POA: Diagnosis not present

## 2019-02-16 NOTE — Progress Notes (Signed)
Office Visit Note   Patient: Cynthia Simpson           Date of Birth: 12-09-1945           MRN: [id number] Visit Date: 02/16/2019              Requested by: Janith Lima, MD [address],  [place] [address] PCP: Janith Lima, MD   Assessment & Plan: Visit Diagnoses: No diagnosis found.  Plan: My impression is improving bilateral lower extremity pain of unclear etiology.  Overall her symptoms sound more related to her vitamin B12 deficiency.  I reviewed her x-rays and they were all normal for her age.  She will continue to take meloxicam as prescribed by her PCP.  I have given her some knee and hip strengthening exercises to do at home.  Questions encouraged and answered.  Follow-Up Instructions: Return if symptoms worsen or fail to improve.   Orders:  No orders of the defined types were placed in this encounter.  No orders of the defined types were placed in this encounter.     Procedures: No procedures performed   Clinical Data: No additional findings.   Subjective: Chief Complaint  Patient presents with  . Left Leg - Pain  . Right Leg - Pain    Cynthia Simpson is a very pleasant 73 year old female comes in for evaluation of intense pain that started 3 weeks ago when she would feel a stinging nerve pain in her legs randomly.  She is very active and does a lot of exercising.  She saw her PCP and was diagnosed with vitamin B 12 deficiency and she has been since getting replacement shots.  She is also been taking meloxicam daily which has helped with the pain.  She states that she is much better now.  She has not really have any pain in her legs or back.   Review of Systems  Constitutional: Negative.   HENT: Negative.   Eyes: Negative.   Respiratory: Negative.   Cardiovascular: Negative.   Endocrine: Negative.   Musculoskeletal: Negative.   Neurological: Negative.   Hematological: Negative.   Psychiatric/Behavioral: Negative.   All other  systems reviewed and are negative.    Objective: Vital Signs: There were no vitals taken for this visit.  Physical Exam Vitals signs and nursing note reviewed.  Constitutional:      Appearance: She is well-developed.  HENT:     Head: Normocephalic and atraumatic.  Neck:     Musculoskeletal: Neck supple.  Pulmonary:     Effort: Pulmonary effort is normal.  Abdominal:     Palpations: Abdomen is soft.  Skin:    General: Skin is warm.     Capillary Refill: Capillary refill takes less than 2 seconds.  Neurological:     Mental Status: She is alert and oriented to person, place, and time.  Psychiatric:        Behavior: Behavior normal.        Thought Content: Thought content normal.        Judgment: Judgment normal.     Ortho Exam Bilateral lower extremity exams are unremarkable.  There is no tenderness palpation there.  There is no swelling there is no bruising.  No focal motor or sensory deficits.  No neurovascular compromise. Specialty Comments:  No specialty comments available.  Imaging: No results found.   PMFS History: Patient Active Problem List   Diagnosis Date Noted  . Vitamin B12 deficiency  neuropathy (HCC) 01/29/2019  . Tachycardia 01/28/2019  . Essential hypertension 01/28/2019  . Leg pain, diffuse, unspecified laterality 01/28/2019  . Hyperglycemia 01/28/2019  . Primary osteoarthritis of both knees 01/28/2019   Past Medical History:  Diagnosis Date  . Bronchitis    2-3 times in her life time  . Pneumonia    2016  . Pneumothorax    20+ years    History reviewed. No pertinent family history.  Past Surgical History:  Procedure Laterality Date  . EYE SURGERY     Social History   Occupational History  . Not on file  Tobacco Use  . Smoking status: Never Smoker  . Smokeless tobacco: Never Used  Substance and Sexual Activity  . Alcohol use: Not on file  . Drug use: Not on file  . Sexual activity: Not on file

## 2019-03-09 ENCOUNTER — Telehealth: Payer: Self-pay | Admitting: Internal Medicine

## 2019-03-09 NOTE — Telephone Encounter (Signed)
ROI fax to Dr. Julianne Handler for patient records

## 2019-03-17 ENCOUNTER — Ambulatory Visit: Payer: Medicare PPO

## 2019-03-19 ENCOUNTER — Telehealth: Payer: Self-pay

## 2019-03-19 ENCOUNTER — Other Ambulatory Visit: Payer: Self-pay

## 2019-03-19 ENCOUNTER — Ambulatory Visit (INDEPENDENT_AMBULATORY_CARE_PROVIDER_SITE_OTHER): Payer: Medicare PPO

## 2019-03-19 DIAGNOSIS — E538 Deficiency of other specified B group vitamins: Secondary | ICD-10-CM | POA: Diagnosis not present

## 2019-03-19 MED ORDER — CYANOCOBALAMIN 1000 MCG/ML IJ SOLN
1000.0000 ug | Freq: Once | INTRAMUSCULAR | Status: AC
  Administered 2019-03-19: 1000 ug via INTRAMUSCULAR

## 2019-03-19 NOTE — Progress Notes (Signed)
Medical screening examination/treatment/procedure(s) were performed by non-physician practitioner and as supervising physician I was immediately available for consultation/collaboration. I agree with above. Brady Plant, MD   

## 2019-03-19 NOTE — Telephone Encounter (Signed)
Copied from CRM [id number]. Topic: Appointment Scheduling - Scheduling Inquiry for Clinic >> Mar 19, 2019  2:22 PM Cynthia Simpson wrote: Reason for CRM:   Pt wants to know when she needs to have labs prior to her appointment on 05/17/2019.

## 2019-03-20 NOTE — Telephone Encounter (Signed)
Contacted pt and informed that we will do blood work after pt sees Dr. Yetta Barre. Pt stated understanding and will keep the March appt.

## 2019-04-19 ENCOUNTER — Ambulatory Visit (INDEPENDENT_AMBULATORY_CARE_PROVIDER_SITE_OTHER): Payer: Medicare PPO | Admitting: *Deleted

## 2019-04-19 ENCOUNTER — Other Ambulatory Visit: Payer: Self-pay

## 2019-04-19 DIAGNOSIS — E538 Deficiency of other specified B group vitamins: Secondary | ICD-10-CM | POA: Diagnosis not present

## 2019-04-19 MED ORDER — CYANOCOBALAMIN 1000 MCG/ML IJ SOLN
1000.0000 ug | Freq: Once | INTRAMUSCULAR | Status: AC
  Administered 2019-04-19: 1000 ug via INTRAMUSCULAR

## 2019-04-19 NOTE — Progress Notes (Signed)
I have reviewed and agree.

## 2019-04-25 ENCOUNTER — Other Ambulatory Visit: Payer: Self-pay | Admitting: Internal Medicine

## 2019-04-25 DIAGNOSIS — M170 Bilateral primary osteoarthritis of knee: Secondary | ICD-10-CM

## 2019-05-03 ENCOUNTER — Telehealth: Payer: Self-pay | Admitting: Internal Medicine

## 2019-05-03 NOTE — Telephone Encounter (Signed)
Patient calling in regards to the meloxicam (MOBIC) 7.5 MG tablet and states pharmacy refilled medication. Patient would like to know if she should continue to take medication or wait until f/u visit on 03/08.

## 2019-05-03 NOTE — Telephone Encounter (Signed)
Contacted pt and she stated that she didn't know if PCP wanted her to continue. Informed that PCP agreed with her taking it or stopped as there is no need to taper. Pt stated that she wanted to stop it just to see how she feels.   Pt has a follow up with [place] in about 2 weeks and we will assess further if needed.

## 2019-05-17 ENCOUNTER — Ambulatory Visit: Payer: Medicare PPO | Admitting: Internal Medicine

## 2019-05-17 ENCOUNTER — Other Ambulatory Visit: Payer: Self-pay

## 2019-05-17 ENCOUNTER — Encounter: Payer: Self-pay | Admitting: Internal Medicine

## 2019-05-17 VITALS — BP 138/86 | HR 90 | Temp 97.9°F | Resp 16 | Ht 66.0 in | Wt 126.0 lb

## 2019-05-17 DIAGNOSIS — G63 Polyneuropathy in diseases classified elsewhere: Secondary | ICD-10-CM | POA: Diagnosis not present

## 2019-05-17 DIAGNOSIS — E538 Deficiency of other specified B group vitamins: Secondary | ICD-10-CM | POA: Diagnosis not present

## 2019-05-17 DIAGNOSIS — I739 Peripheral vascular disease, unspecified: Secondary | ICD-10-CM | POA: Insufficient documentation

## 2019-05-17 DIAGNOSIS — I7381 Erythromelalgia: Secondary | ICD-10-CM | POA: Insufficient documentation

## 2019-05-17 DIAGNOSIS — Z1159 Encounter for screening for other viral diseases: Secondary | ICD-10-CM

## 2019-05-17 DIAGNOSIS — E785 Hyperlipidemia, unspecified: Secondary | ICD-10-CM | POA: Insufficient documentation

## 2019-05-17 LAB — CBC WITH DIFFERENTIAL/PLATELET
Basophils Absolute: 0 10*3/uL (ref 0.0–0.1)
Basophils Relative: 0.5 % (ref 0.0–3.0)
Eosinophils Absolute: 0 10*3/uL (ref 0.0–0.7)
Eosinophils Relative: 0.7 % (ref 0.0–5.0)
HCT: 42.2 % (ref 36.0–46.0)
Hemoglobin: 14.5 g/dL (ref 12.0–15.0)
Lymphocytes Relative: 22.7 % (ref 12.0–46.0)
Lymphs Abs: 1.6 10*3/uL (ref 0.7–4.0)
MCHC: 34.4 g/dL (ref 30.0–36.0)
MCV: 95.6 fl (ref 78.0–100.0)
Monocytes Absolute: 0.4 10*3/uL (ref 0.1–1.0)
Monocytes Relative: 6.3 % (ref 3.0–12.0)
Neutro Abs: 5 10*3/uL (ref 1.4–7.7)
Neutrophils Relative %: 69.8 % (ref 43.0–77.0)
Platelets: 262 10*3/uL (ref 150.0–400.0)
RBC: 4.42 Mil/uL (ref 3.87–5.11)
RDW: 13.9 % (ref 11.5–15.5)
WBC: 7.1 10*3/uL (ref 4.0–10.5)

## 2019-05-17 LAB — LIPID PANEL
Cholesterol: 212 mg/dL — ABNORMAL HIGH (ref 0–200)
HDL: 76.1 mg/dL (ref >39.00)
LDL Cholesterol: 111 mg/dL — ABNORMAL HIGH (ref 0–99)
NonHDL: 135.47
Total CHOL/HDL Ratio: 3
Triglycerides: 122 mg/dL (ref 0.0–149.0)
VLDL: 24.4 mg/dL (ref 0.0–40.0)

## 2019-05-17 LAB — TSH: TSH: 3.23 u[IU]/mL (ref 0.35–4.50)

## 2019-05-17 MED ORDER — GABAPENTIN 100 MG PO CAPS: 100.0000 mg | ORAL_CAPSULE | Freq: Three times a day (TID) | ORAL | 3 refills | Status: DC

## 2019-05-17 MED ORDER — ROSUVASTATIN CALCIUM 5 MG PO TABS: 5.0000 mg | ORAL_TABLET | Freq: Every day | ORAL | 1 refills | Status: DC

## 2019-05-17 MED ORDER — CYANOCOBALAMIN 1000 MCG/ML IJ SOLN
1000.0000 ug | Freq: Once | INTRAMUSCULAR | Status: AC
  Administered 2019-05-17: 08:00:00 1000 ug via INTRAMUSCULAR

## 2019-05-17 NOTE — Patient Instructions (Signed)
Vitamin B12 Deficiency Vitamin B12 deficiency occurs when the body does not have enough vitamin B12, which is an important vitamin. The body needs this vitamin:  To make red blood cells.  To make DNA. This is the genetic material inside cells.  To help the nerves work properly so they can carry messages from the brain to the body. Vitamin B12 deficiency can cause various health problems, such as a low red blood cell count (anemia) or nerve damage. What are the causes? This condition may be caused by:  Not eating enough foods that contain vitamin B12.  Not having enough stomach acid and digestive fluids to properly absorb vitamin B12 from the food that you eat.  Certain digestive system diseases that make it hard to absorb vitamin B12. These diseases include Crohn's disease, chronic pancreatitis, and cystic fibrosis.  A condition in which the body does not make enough of a protein (intrinsic factor), resulting in too few red blood cells (pernicious anemia).  Having a surgery in which part of the stomach or small intestine is removed.  Taking certain medicines that make it hard for the body to absorb vitamin B12. These medicines include: ? Heartburn medicines (antacids and proton pump inhibitors). ? Certain antibiotic medicines. ? Some medicines that are used to treat diabetes, tuberculosis, gout, or high cholesterol. What increases the risk? The following factors may make you more likely to develop a B12 deficiency:  Being older than age 50.  Eating a vegetarian or vegan diet, especially while you are pregnant.  Eating a poor diet while you are pregnant.  Taking certain medicines.  Having alcoholism. What are the signs or symptoms? In some cases, there are no symptoms of this condition. If the condition leads to anemia or nerve damage, various symptoms can occur, such as:  Weakness.  Fatigue.  Loss of appetite.  Weight loss.  Numbness or tingling in your hands and  feet.  Redness and burning of the tongue.  Confusion or memory problems.  Depression.  Sensory problems, such as color blindness, ringing in the ears, or loss of taste.  Diarrhea or constipation.  Trouble walking. If anemia is severe, symptoms can include:  Shortness of breath.  Dizziness.  Rapid heart rate (tachycardia). How is this diagnosed? This condition may be diagnosed with a blood test to measure the level of vitamin B12 in your blood. You may also have other tests, including:  A group of tests that measure certain characteristics of blood cells (complete blood count, CBC).  A blood test to measure intrinsic factor.  A procedure where a thin tube with a camera on the end is used to look into your stomach or intestines (endoscopy). Other tests may be needed to discover the cause of B12 deficiency. How is this treated? Treatment for this condition depends on the cause. This condition may be treated by:  Changing your eating and drinking habits, such as: ? Eating more foods that contain vitamin B12. ? Drinking less alcohol or no alcohol.  Getting vitamin B12 injections.  Taking vitamin B12 supplements. Your health care provider will tell you which dosage is best for you. Follow these instructions at home: Eating and drinking   Eat lots of healthy foods that contain vitamin B12, including: ? Meats and poultry. This includes beef, pork, chicken, turkey, and organ meats, such as liver. ? Seafood. This includes clams, rainbow trout, salmon, tuna, and haddock. ? Eggs. ? Cereal and dairy products that are fortified. This means that vitamin B12   has been added to the food. Check the label on the package to see if the food is fortified. The items listed above may not be a complete list of recommended foods and beverages. Contact a dietitian for more information. General instructions  Get any injections that are prescribed by your health care provider.  Take  supplements only as told by your health care provider. Follow the directions carefully.  Do not drink alcohol if your health care provider tells you not to. In some cases, you may only be asked to limit alcohol use.  Keep all follow-up visits as told by your health care provider. This is important. Contact a health care provider if:  Your symptoms come back. Get help right away if you:  Develop shortness of breath.  Have a rapid heart rate.  Have chest pain.  Become dizzy or lose consciousness. Summary  Vitamin B12 deficiency occurs when the body does not have enough vitamin B12.  The main causes of vitamin B12 deficiency include dietary deficiency, digestive diseases, pernicious anemia, and having a surgery in which part of the stomach or small intestine is removed.  In some cases, there are no symptoms of this condition. If the condition leads to anemia or nerve damage, various symptoms can occur, such as weakness, shortness of breath, and numbness.  Treatment may include getting vitamin B12 injections or taking vitamin B12 supplements. Eat lots of healthy foods that contain vitamin B12. This information is not intended to replace advice given to you by your health care provider. Make sure you discuss any questions you have with your health care provider. Document Revised: 08/14/2018 Document Reviewed: 11/04/2017 Elsevier Patient Education  2020 [employer]  

## 2019-05-17 NOTE — Progress Notes (Signed)
Subjective:  Patient ID: Cynthia Simpson, female    DOB: Jul 29, 1945  Age: 74 y.o. MRN: [id number]  CC: Osteoarthritis  This visit occurred during the SARS-CoV-2 public health emergency.  Safety protocols were in place, including screening questions prior to the visit, additional usage of staff PPE, and extensive cleaning of exam room while observing appropriate contact time as indicated for disinfecting solutions.    HPI PURVA VESSELL presents for f/up - She complains that over the last 6 weeks she has developed a red, painful lesions on the top of her toes.  She continues to describe a compulsive walking regimen.  She thinks she injured her toes on the top of her shoes.  She continues to complain of lower extremity discomfort.  She has arthralgias but also says sometimes she feels like there is a vice clamping on her lower extremities with activity.  She has been seen by orthopedics.   Outpatient Medications Prior to Visit  Medication Sig Dispense Refill  . albuterol (PROVENTIL HFA;VENTOLIN HFA) 108 (90 Base) MCG/ACT inhaler Inhale 2 puffs into the lungs every 4 (four) hours as needed for wheezing or shortness of breath (and cough x 5-7 days). 1 Inhaler 0  . Cholecalciferol (VITAMIN D PO) Take 2 capsules by mouth daily.    . diazepam (VALIUM) 2 MG tablet Take 1 tablet (2 mg total) by mouth every 12 (twelve) hours as needed (dizziness). 5 tablet 0  . meloxicam (MOBIC) 7.5 MG tablet TAKE 1 TABLET BY MOUTH EVERY DAY 90 tablet 0  . Omega-3 Fatty Acids (OMEGA 3 PO) Take 2 capsules by mouth daily.    Marland Kitchen zinc gluconate 50 MG tablet Take 50 mg by mouth daily.     No facility-administered medications prior to visit.    ROS Review of Systems  Constitutional: Negative for chills, diaphoresis, fatigue and fever.  HENT: Negative.   Eyes: Negative.   Respiratory: Negative for cough, chest tightness, shortness of breath and wheezing.   Cardiovascular: Negative for chest pain,  palpitations and leg swelling.  Gastrointestinal: Negative for abdominal pain, diarrhea, nausea and vomiting.  Endocrine: Negative.   Genitourinary: Negative.  Negative for difficulty urinating.  Musculoskeletal: Positive for arthralgias. Negative for back pain and myalgias.  Skin: Positive for color change. Negative for wound.  Neurological: Negative.  Negative for dizziness, weakness, light-headedness, numbness and headaches.  Hematological: Negative for adenopathy. Does not bruise/bleed easily.  Psychiatric/Behavioral: Negative.     Objective:  BP 138/86   Pulse 90   Temp 97.9 F (36.6 C) (Oral)   Resp 16   Ht 5\' 6"  (1.676 m)   Wt 126 lb (57.2 kg)   SpO2 99%   BMI 20.34 kg/m   BP Readings from Last 3 Encounters:  05/17/19 138/86  02/08/19 140/88  01/28/19 (!) 146/96    Wt Readings from Last 3 Encounters:  05/17/19 126 lb (57.2 kg)  02/08/19 123 lb 12 oz (56.1 kg)  01/28/19 127 lb (57.6 kg)    Physical Exam Vitals reviewed.  HENT:     Nose: Nose normal.     Mouth/Throat:     Mouth: Mucous membranes are moist.  Eyes:     General: No scleral icterus.    Conjunctiva/sclera: Conjunctivae normal.  Cardiovascular:     Rate and Rhythm: Normal rate and regular rhythm.     Pulses:          Dorsalis pedis pulses are 0 on the right side and 0  on the left side.       Posterior tibial pulses are 1+ on the right side and 1+ on the left side.     Heart sounds: No murmur.  Pulmonary:     Effort: Pulmonary effort is normal.     Breath sounds: No stridor. No wheezing, rhonchi or rales.  Abdominal:     General: Abdomen is flat.     Palpations: There is no mass.     Tenderness: There is no abdominal tenderness. There is no guarding.  Musculoskeletal:        General: Normal range of motion.     Cervical back: Neck supple.     Right lower leg: No edema.     Left lower leg: No edema.  Feet:     Right foot:     Skin integrity: Erythema present. No ulcer, blister, skin  breakdown, warmth or dry skin.     Toenail Condition: Right toenails are normal.     Left foot:     Skin integrity: Erythema present. No ulcer, blister, skin breakdown, warmth or dry skin.     Toenail Condition: Left toenails are normal.     Comments: On the dorsum of both toes, more prominently on the left than the right, there are areas of well-demarcated erythema with no ulcers or breakdown.  See photo. Lymphadenopathy:     Cervical: No cervical adenopathy.  Skin:    General: Skin is warm and dry.     Coloration: Skin is not pale.     Findings: Erythema present.  Neurological:     General: No focal deficit present.     Lab Results  Component Value Date   WBC 7.1 05/17/2019   HGB 14.5 05/17/2019   HCT 42.2 05/17/2019   PLT 262.0 05/17/2019   GLUCOSE 105 (H) 01/28/2019   CHOL 212 (H) 05/17/2019   TRIG 122.0 05/17/2019   HDL 76.10 05/17/2019   LDLCALC 111 (H) 05/17/2019   ALT 14 01/28/2019   AST 15 01/28/2019   NA 138 01/28/2019   K 3.5 01/28/2019   CL 102 01/28/2019   CREATININE 0.75 01/28/2019   BUN 18 01/28/2019   CO2 26 01/28/2019   TSH 3.23 05/17/2019   HGBA1C 5.4 01/28/2019    DG Tibia/Fibula Left  Result Date: 01/28/2019 CLINICAL DATA:  Left leg pain.  No known injury. EXAM: LEFT TIBIA AND FIBULA - 2 VIEW COMPARISON:  None. FINDINGS: Slight joint space narrowing within the left knee joint. No acute bony abnormality. Specifically, no fracture, subluxation, or dislocation. No joint effusion within the left knee. Soft tissues are intact. IMPRESSION: No acute bony abnormality. Electronically Signed   By: Charlett Nose M.D.   On: 01/28/2019 23:03   DG Tibia/Fibula Right  Result Date: 01/28/2019 CLINICAL DATA:  Right leg pain.  No known injury. EXAM: RIGHT TIBIA AND FIBULA - 2 VIEW COMPARISON:  None FINDINGS: Slight joint space narrowing within the right knee joint. No joint effusion. No acute bony abnormality. Specifically, no fracture, subluxation, or dislocation.  Soft tissues intact. IMPRESSION: No acute bony abnormality. Electronically Signed   By: Charlett Nose M.D.   On: 01/28/2019 23:03   DG FEMUR MIN 2 VIEWS LEFT  Result Date: 01/28/2019 CLINICAL DATA:  Left upper leg pain.  No known injury. EXAM: LEFT FEMUR 2 VIEWS COMPARISON:  None. FINDINGS: No acute bony abnormality. Specifically, no fracture, subluxation, or dislocation. Mild joint space narrowing within the left knee joint. No joint effusion. IMPRESSION: No  acute bony abnormality. Mild degenerative changes in the left knee. Electronically Signed   By: Rolm Baptise M.D.   On: 01/28/2019 23:02   DG FEMUR, MIN 2 VIEWS RIGHT  Result Date: 01/28/2019 CLINICAL DATA:  Right upper leg pain.  No known injury. EXAM: RIGHT FEMUR 2 VIEWS COMPARISON:  None. FINDINGS: No acute bony abnormality. Specifically, no fracture, subluxation, or dislocation. Joint space narrowing within the knee joint. No visible joint effusion. IMPRESSION: No acute bony abnormality. Mild degenerative changes in the right knee joint. Electronically Signed   By: Rolm Baptise M.D.   On: 01/28/2019 23:02    Assessment & Plan:   Taysia was seen today for osteoarthritis.  Diagnoses and all orders for this visit:  Vitamin B12 deficiency neuropathy ([place])- I have asked her to continue to receive B12 injections every month or 2. -     cyanocobalamin ((VITAMIN B-12)) injection 1,000 mcg -     CBC with Differential/Platelet  Claudication of both lower extremities ([place])- She has lower extremity symptoms and decreased pulses.  I recommended that she undergo arterial flow studies to screen for PAD. 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 -     gabapentin (NEURONTIN) 100 MG capsule; Take 1 capsule (100 mg total) by mouth 3 (three) times daily.  Hyperlipidemia LDL goal <130- She  has a mildly elevated ASCVD risk score.  I have asked her to take a statin for CV risk reduction. -     Lipid panel -     TSH -     rosuvastatin (CRESTOR) 5 MG tablet; Take 1 tablet (5 mg total) by mouth daily.  Need for hepatitis C screening test -     Hepatitis C antibody   I am having Judeth Porch T. Swayne start on gabapentin and rosuvastatin. I am also having her maintain her Omega-3 Fatty Acids (OMEGA 3 PO), Cholecalciferol (VITAMIN D PO), diazepam, albuterol, zinc gluconate, and meloxicam. We administered cyanocobalamin.  Meds ordered this encounter  Medications  . cyanocobalamin ((VITAMIN B-12)) injection 1,000 mcg  . gabapentin (NEURONTIN) 100 MG capsule    Sig: Take 1 capsule (100 mg total) by mouth 3 (three) times daily.    Dispense:  90 capsule    Refill:  3  . rosuvastatin (CRESTOR) 5 MG tablet    Sig: Take 1 tablet (5 mg total) by mouth daily.    Dispense:  90 tablet    Refill:  1     Follow-up: Return in about 3 months (around 08/17/2019).  Scarlette Calico, MD

## 2019-05-18 ENCOUNTER — Telehealth: Payer: Self-pay

## 2019-05-18 ENCOUNTER — Encounter: Payer: Self-pay | Admitting: Internal Medicine

## 2019-05-18 DIAGNOSIS — I7381 Erythromelalgia: Secondary | ICD-10-CM

## 2019-05-18 LAB — HEPATITIS C ANTIBODY
Hepatitis C Ab: NONREACTIVE
SIGNAL TO CUT-OFF: 0.01 (ref <1.00)

## 2019-05-18 NOTE — Telephone Encounter (Signed)
  New message   The patient was seen yesterday, was told gabapentin (NEURONTIN) 100 MG capsule would be called in. Her son went to pick up medication and was given a statin medication.   The patient called [facility] this morning they do not have a prescription for her.   Please advise.    [address] AT [facility] [address]  [address] [place] [address]  Phone:  [phone] Fax:  [phone]

## 2019-05-18 NOTE — Telephone Encounter (Signed)
Informed pt that rx was sent. Pt stated that she will call the pharmacy and have them get it ready for her.

## 2019-05-19 MED ORDER — GABAPENTIN 100 MG PO CAPS: 100.0000 mg | ORAL_CAPSULE | Freq: Three times a day (TID) | ORAL | 3 refills | Status: DC

## 2019-05-19 NOTE — Addendum Note (Signed)
Addended by: Verlan Friends on: 05/19/2019 10:32 AM   Modules accepted: Orders

## 2019-05-19 NOTE — Telephone Encounter (Signed)
New message:   Pt is calling and ask can we please send over the refill request again for gabapentin (NEURONTIN) 100 MG capsule due to the pharmacy not receiving the order just yet. Pt also ask can we make it a easy open capsule. Please advise.

## 2019-05-19 NOTE — Telephone Encounter (Signed)
erx sent per patient request.   There is no option for an easy open capsule.

## 2019-05-27 ENCOUNTER — Telehealth (HOSPITAL_COMMUNITY): Payer: Self-pay

## 2019-05-27 NOTE — Telephone Encounter (Signed)

## 2019-05-28 ENCOUNTER — Other Ambulatory Visit: Payer: Self-pay

## 2019-05-28 ENCOUNTER — Ambulatory Visit (HOSPITAL_COMMUNITY)
Admission: RE | Admit: 2019-05-28 | Discharge: 2019-05-28 | Disposition: A | Discharge status: Home / Self Care | Payer: Medicare PPO | Source: Ambulatory Visit | Attending: Internal Medicine | Admitting: Internal Medicine

## 2019-05-28 ENCOUNTER — Encounter: Payer: Self-pay | Admitting: Internal Medicine

## 2019-05-28 DIAGNOSIS — I739 Peripheral vascular disease, unspecified: Secondary | ICD-10-CM

## 2019-06-08 ENCOUNTER — Ambulatory Visit: Payer: Medicare PPO | Admitting: Internal Medicine

## 2019-06-18 ENCOUNTER — Ambulatory Visit (INDEPENDENT_AMBULATORY_CARE_PROVIDER_SITE_OTHER): Payer: Medicare PPO | Admitting: *Deleted

## 2019-06-18 ENCOUNTER — Other Ambulatory Visit: Payer: Self-pay

## 2019-06-18 ENCOUNTER — Telehealth: Payer: Self-pay | Admitting: *Deleted

## 2019-06-18 DIAGNOSIS — E538 Deficiency of other specified B group vitamins: Secondary | ICD-10-CM

## 2019-06-18 MED ORDER — CYANOCOBALAMIN 1000 MCG/ML IJ SOLN
1000.0000 ug | Freq: Once | INTRAMUSCULAR | Status: AC
  Administered 2019-06-18: 1000 ug via INTRAMUSCULAR

## 2019-06-18 NOTE — Telephone Encounter (Signed)
Pt came in for her B12 injection. She states she was not told how long she need to take injection. Inform Pt MD is out of the office today, but will send msg and call her back w/his response once he review msg.Marland KitchenRaechel Chute

## 2019-06-18 NOTE — Telephone Encounter (Signed)
Pt sent msg back stating "  Monthly for the rest of her life    Called pt to inform her what MD stated.Marland KitchenRaechel Chute

## 2019-06-18 NOTE — Progress Notes (Signed)
MD is out of the office today can u cosign for B12 inj.Marland KitchenRaechel Chute

## 2019-06-18 NOTE — Progress Notes (Addendum)
Pls cosign for B12 inj since PCP is out of the office today.Marland KitchenRaechel Chute I have reviewed and agree

## 2019-07-08 DIAGNOSIS — H26493 Other secondary cataract, bilateral: Secondary | ICD-10-CM | POA: Diagnosis not present

## 2019-07-08 DIAGNOSIS — H52203 Unspecified astigmatism, bilateral: Secondary | ICD-10-CM | POA: Diagnosis not present

## 2019-07-08 DIAGNOSIS — H5213 Myopia, bilateral: Secondary | ICD-10-CM | POA: Diagnosis not present

## 2019-07-08 DIAGNOSIS — Z961 Presence of intraocular lens: Secondary | ICD-10-CM | POA: Diagnosis not present

## 2019-07-19 ENCOUNTER — Other Ambulatory Visit: Payer: Self-pay | Admitting: *Deleted

## 2019-07-19 ENCOUNTER — Ambulatory Visit (INDEPENDENT_AMBULATORY_CARE_PROVIDER_SITE_OTHER): Payer: Medicare PPO | Admitting: *Deleted

## 2019-07-19 ENCOUNTER — Other Ambulatory Visit: Payer: Self-pay

## 2019-07-19 DIAGNOSIS — I7381 Erythromelalgia: Secondary | ICD-10-CM

## 2019-07-19 DIAGNOSIS — E538 Deficiency of other specified B group vitamins: Secondary | ICD-10-CM

## 2019-07-19 MED ORDER — CYANOCOBALAMIN 1000 MCG/ML IJ SOLN
1000.0000 ug | Freq: Once | INTRAMUSCULAR | Status: AC
  Administered 2019-07-19: 1000 ug via INTRAMUSCULAR

## 2019-07-19 MED ORDER — GABAPENTIN 100 MG PO CAPS: 100.0000 mg | ORAL_CAPSULE | Freq: Three times a day (TID) | ORAL | 0 refills | Status: DC

## 2019-07-19 NOTE — Progress Notes (Signed)
Pls cosign for B12 inj since PCP is out of the office today../lmb 

## 2019-07-19 NOTE — Telephone Encounter (Signed)
Pt called requesting refill on her Gabapentin 100 mg take 1 three times a day. Reviewed chart pt is up-to-date sent refills to [facility].Marland KitchenRaechel Chute

## 2019-07-20 DIAGNOSIS — H26491 Other secondary cataract, right eye: Secondary | ICD-10-CM | POA: Diagnosis not present

## 2019-07-22 ENCOUNTER — Other Ambulatory Visit: Payer: Self-pay | Admitting: Internal Medicine

## 2019-07-22 DIAGNOSIS — M170 Bilateral primary osteoarthritis of knee: Secondary | ICD-10-CM

## 2019-07-27 DIAGNOSIS — H26492 Other secondary cataract, left eye: Secondary | ICD-10-CM | POA: Diagnosis not present

## 2019-08-20 ENCOUNTER — Other Ambulatory Visit: Payer: Self-pay

## 2019-08-20 ENCOUNTER — Ambulatory Visit (INDEPENDENT_AMBULATORY_CARE_PROVIDER_SITE_OTHER): Payer: Medicare PPO | Admitting: *Deleted

## 2019-08-20 DIAGNOSIS — E538 Deficiency of other specified B group vitamins: Secondary | ICD-10-CM | POA: Diagnosis not present

## 2019-08-20 MED ORDER — CYANOCOBALAMIN 1000 MCG/ML IJ SOLN
1000.0000 ug | Freq: Once | INTRAMUSCULAR | Status: AC
  Administered 2019-08-20: 1000 ug via INTRAMUSCULAR

## 2019-08-20 NOTE — Progress Notes (Addendum)
I have reviewed and agree  Pls cosign for B12 inj in absence of PCP not in the office today...Raechel Chute

## 2019-09-20 ENCOUNTER — Other Ambulatory Visit: Payer: Self-pay | Admitting: Internal Medicine

## 2019-09-20 ENCOUNTER — Telehealth: Payer: Self-pay | Admitting: *Deleted

## 2019-09-20 ENCOUNTER — Other Ambulatory Visit: Payer: Self-pay

## 2019-09-20 ENCOUNTER — Ambulatory Visit (INDEPENDENT_AMBULATORY_CARE_PROVIDER_SITE_OTHER): Payer: Medicare PPO | Admitting: *Deleted

## 2019-09-20 DIAGNOSIS — I7381 Erythromelalgia: Secondary | ICD-10-CM

## 2019-09-20 DIAGNOSIS — M170 Bilateral primary osteoarthritis of knee: Secondary | ICD-10-CM

## 2019-09-20 DIAGNOSIS — G63 Polyneuropathy in diseases classified elsewhere: Secondary | ICD-10-CM

## 2019-09-20 DIAGNOSIS — E538 Deficiency of other specified B group vitamins: Secondary | ICD-10-CM

## 2019-09-20 MED ORDER — GABAPENTIN 100 MG PO CAPS: 100.0000 mg | ORAL_CAPSULE | Freq: Three times a day (TID) | ORAL | 1 refills | Status: DC

## 2019-09-20 MED ORDER — CYANOCOBALAMIN 1000 MCG/ML IJ SOLN
1000.0000 ug | Freq: Once | INTRAMUSCULAR | Status: AC
  Administered 2019-09-20: 1000 ug via INTRAMUSCULAR

## 2019-09-20 MED ORDER — MELOXICAM 7.5 MG PO TABS: 7.5000 mg | ORAL_TABLET | Freq: Every day | ORAL | 0 refills | Status: DC

## 2019-09-20 NOTE — Telephone Encounter (Signed)
Rec'd call pt is wanting to know if she need to continue taking the Meloxicam and Gabapentin. She states her symptoms are better for the most part. She also states that she is completely out of the gabapentin and was told by someone that she should gradually come off. Pls advise.Marland KitchenRaechel Chute

## 2019-09-20 NOTE — Telephone Encounter (Signed)
Notified pt w/MD response.../lmb 

## 2019-09-20 NOTE — Progress Notes (Signed)
Pls cosign for B12 inj../lmb  

## 2019-10-22 ENCOUNTER — Other Ambulatory Visit: Payer: Self-pay

## 2019-10-22 ENCOUNTER — Ambulatory Visit (INDEPENDENT_AMBULATORY_CARE_PROVIDER_SITE_OTHER): Payer: Medicare PPO | Admitting: *Deleted

## 2019-10-22 DIAGNOSIS — E538 Deficiency of other specified B group vitamins: Secondary | ICD-10-CM

## 2019-10-22 MED ORDER — CYANOCOBALAMIN 1000 MCG/ML IJ SOLN
1000.0000 ug | Freq: Once | INTRAMUSCULAR | Status: AC
  Administered 2019-10-22: 1000 ug via INTRAMUSCULAR

## 2019-10-22 NOTE — Progress Notes (Signed)
Pls cosign for B12 inj in absence of PCP../lmb   

## 2019-11-04 ENCOUNTER — Other Ambulatory Visit: Payer: Self-pay | Admitting: Internal Medicine

## 2019-11-04 DIAGNOSIS — E785 Hyperlipidemia, unspecified: Secondary | ICD-10-CM

## 2019-11-24 ENCOUNTER — Ambulatory Visit (INDEPENDENT_AMBULATORY_CARE_PROVIDER_SITE_OTHER): Payer: Medicare PPO

## 2019-11-24 ENCOUNTER — Other Ambulatory Visit: Payer: Self-pay

## 2019-11-24 DIAGNOSIS — Z23 Encounter for immunization: Secondary | ICD-10-CM

## 2019-11-24 DIAGNOSIS — E538 Deficiency of other specified B group vitamins: Secondary | ICD-10-CM | POA: Diagnosis not present

## 2019-11-24 MED ORDER — CYANOCOBALAMIN 1000 MCG/ML IJ SOLN
1000.0000 ug | INTRAMUSCULAR | Status: DC
  Administered 2019-11-24: 1000 ug via INTRAMUSCULAR

## 2019-11-24 NOTE — Progress Notes (Signed)
Pt here for monthly B12 injection per Dr Yetta Barre.  B12 given IM left deltoid and pt tolerated injection well.  Pt has questions medications, appt made for f/u & medication discussion for 9/29.  Pt scheduled for next b12 injection on 10/18.

## 2019-12-08 ENCOUNTER — Ambulatory Visit: Payer: Medicare PPO | Admitting: Internal Medicine

## 2019-12-08 ENCOUNTER — Encounter: Payer: Self-pay | Admitting: Internal Medicine

## 2019-12-08 ENCOUNTER — Other Ambulatory Visit: Payer: Self-pay

## 2019-12-08 VITALS — BP 138/84 | HR 90 | Temp 98.5°F | Resp 16 | Ht 66.0 in | Wt 127.0 lb

## 2019-12-08 DIAGNOSIS — M170 Bilateral primary osteoarthritis of knee: Secondary | ICD-10-CM

## 2019-12-08 DIAGNOSIS — G63 Polyneuropathy in diseases classified elsewhere: Secondary | ICD-10-CM

## 2019-12-08 DIAGNOSIS — E785 Hyperlipidemia, unspecified: Secondary | ICD-10-CM

## 2019-12-08 DIAGNOSIS — Z23 Encounter for immunization: Secondary | ICD-10-CM | POA: Diagnosis not present

## 2019-12-08 DIAGNOSIS — E538 Deficiency of other specified B group vitamins: Secondary | ICD-10-CM

## 2019-12-08 DIAGNOSIS — I10 Essential (primary) hypertension: Secondary | ICD-10-CM

## 2019-12-08 NOTE — Progress Notes (Signed)
Subjective:  Patient ID: Cynthia Simpson, female    DOB: 1945/06/23  Age: 74 y.o. MRN: [id number]  CC: Osteoarthritis  This visit occurred during the SARS-CoV-2 public health emergency.  Safety protocols were in place, including screening questions prior to the visit, additional usage of staff PPE, and extensive cleaning of exam room while observing appropriate contact time as indicated for disinfecting solutions.    HPI Cynthia Simpson presents for f/up - She tells me her blood pressure has been well controlled.  She is active and denies any recent episodes of CP, DOE, palpitations, edema, or fatigue.  She tells me her legs feel much better and she has decided to stop taking gabapentin and meloxicam.  She has also decided she does not want to take a statin.  She continues to have numbness on the bottoms of her feet but since she started receiving B12 injections that has gotten better.  Outpatient Medications Prior to Visit  Medication Sig Dispense Refill  . Cholecalciferol (VITAMIN D PO) Take 2 capsules by mouth daily.    . Omega-3 Fatty Acids (OMEGA 3 PO) Take 2 capsules by mouth daily.    Marland Kitchen zinc gluconate 50 MG tablet Take 50 mg by mouth daily.    Marland Kitchen gabapentin (NEURONTIN) 100 MG capsule Take 1 capsule (100 mg total) by mouth 3 (three) times daily. 270 capsule 1  . meloxicam (MOBIC) 7.5 MG tablet Take 1 tablet (7.5 mg total) by mouth daily. 90 tablet 0  . rosuvastatin (CRESTOR) 5 MG tablet TAKE 1 TABLET BY MOUTH EVERY DAY 90 tablet 1  . cyanocobalamin ((VITAMIN B-12)) injection 1,000 mcg      No facility-administered medications prior to visit.    ROS Review of Systems  Constitutional: Negative for appetite change, diaphoresis and fatigue.  HENT: Negative.   Eyes: Negative for visual disturbance.  Respiratory: Negative for cough, chest tightness, shortness of breath and wheezing.   Cardiovascular: Negative for palpitations and leg swelling.  Gastrointestinal: Negative for  abdominal pain, blood in stool, constipation and diarrhea.  Endocrine: Negative.   Genitourinary: Negative.  Negative for difficulty urinating.  Musculoskeletal: Positive for arthralgias. Negative for back pain, myalgias and neck pain.  Skin: Negative.   Neurological: Negative for dizziness, weakness, light-headedness and headaches.  Hematological: Negative for adenopathy. Does not bruise/bleed easily.  Psychiatric/Behavioral: Negative.     Objective:  BP 138/84   Pulse 90   Temp 98.5 F (36.9 C) (Oral)   Resp 16   Ht 5\' 6"  (1.676 m)   Wt 127 lb (57.6 kg)   SpO2 95%   BMI 20.50 kg/m   BP Readings from Last 3 Encounters:  12/08/19 138/84  05/17/19 138/86  02/08/19 140/88    Wt Readings from Last 3 Encounters:  12/08/19 127 lb (57.6 kg)  05/17/19 126 lb (57.2 kg)  02/08/19 123 lb 12 oz (56.1 kg)    Physical Exam Vitals reviewed.  HENT:     Nose: Nose normal.     Mouth/Throat:     Mouth: Mucous membranes are moist.  Eyes:     General: No scleral icterus.    Conjunctiva/sclera: Conjunctivae normal.  Cardiovascular:     Rate and Rhythm: Normal rate and regular rhythm.     Heart sounds: No murmur heard.   Pulmonary:     Effort: Pulmonary effort is normal.     Breath sounds: No stridor. No wheezing, rhonchi or rales.  Abdominal:     General: Abdomen is flat. Bowel  sounds are normal. There is no distension.     Palpations: Abdomen is soft. There is no hepatomegaly, splenomegaly or mass.     Tenderness: There is no abdominal tenderness.  Musculoskeletal:        General: Normal range of motion.     Cervical back: Neck supple.     Right lower leg: No edema.     Left lower leg: No edema.  Lymphadenopathy:     Cervical: No cervical adenopathy.  Skin:    General: Skin is warm and dry.  Neurological:     General: No focal deficit present.     Mental Status: She is alert.     Lab Results  Component Value Date   WBC 7.1 05/17/2019   HGB 14.5 05/17/2019   HCT  42.2 05/17/2019   PLT 262.0 05/17/2019   GLUCOSE 105 (H) 01/28/2019   CHOL 212 (H) 05/17/2019   TRIG 122.0 05/17/2019   HDL 76.10 05/17/2019   LDLCALC 111 (H) 05/17/2019   ALT 14 01/28/2019   AST 15 01/28/2019   NA 138 01/28/2019   K 3.5 01/28/2019   CL 102 01/28/2019   CREATININE 0.75 01/28/2019   BUN 18 01/28/2019   CO2 26 01/28/2019   TSH 3.23 05/17/2019   HGBA1C 5.4 01/28/2019    VAS [place] ABI WITH/WO TBI  Result Date: 05/28/2019 LOWER EXTREMITY DOPPLER STUDY Indications: Bilateral leg pain . High Risk Factors: Hyperlipidemia. Other Factors: History of walking 5 miles. Redish toes bilaterally.  Comparison Study: No prior exam Performing Technologist: Elita Quick RVT  Examination Guidelines: A complete evaluation includes at minimum, Doppler waveform signals and systolic blood pressure reading at the level of bilateral brachial, anterior tibial, and posterior tibial arteries, when vessel segments are accessible. Bilateral testing is considered an integral part of a complete examination. Photoelectric Plethysmograph (PPG) waveforms and toe systolic pressure readings are included as required and additional duplex testing as needed. Limited examinations for reoccurring indications may be performed as noted.  ABI Findings: +---------+------------------+-----+--------+--------+ Right    Rt Pressure (mmHg)IndexWaveformComment  +---------+------------------+-----+--------+--------+ Brachial 158                                     +---------+------------------+-----+--------+--------+ PTA      175               1.11 biphasic         +---------+------------------+-----+--------+--------+ DP       165               1.04 biphasic         +---------+------------------+-----+--------+--------+ Great Toe124               0.78 Normal           +---------+------------------+-----+--------+--------+ +---------+------------------+-----+--------+-------+ Left     Lt Pressure  (mmHg)IndexWaveformComment +---------+------------------+-----+--------+-------+ Brachial 153                                    +---------+------------------+-----+--------+-------+ PTA      176               1.11 biphasic        +---------+------------------+-----+--------+-------+ DP       155               0.98 biphasic        +---------+------------------+-----+--------+-------+  Great Toe110               0.70 Normal          +---------+------------------+-----+--------+-------+ +-------+-----------+-----------+------------+------------+ ABI/TBIToday's ABIToday's TBIPrevious ABIPrevious TBI +-------+-----------+-----------+------------+------------+ Right  1.11       0.78                                +-------+-----------+-----------+------------+------------+ Left   1.11       0.70                                +-------+-----------+-----------+------------+------------+  Summary: Right: Resting right ankle-brachial index is within normal range. No evidence of significant right lower extremity arterial disease. The right toe-brachial index is normal. Left: Resting left ankle-brachial index is within normal range. No evidence of significant left lower extremity arterial disease. The left toe-brachial index is normal.  *See table(s) above for measurements and observations.  Electronically signed by Lemar Livings MD on 05/28/2019 at 10:58:43 AM.    Final     Assessment & Plan:   Cynthia Simpson was seen today for osteoarthritis.  Diagnoses and all orders for this visit:  Flu vaccine need -     Flu Vaccine QUAD High Dose(Fluad)  Primary osteoarthritis of both knees- She is doing much better and has decided she does not want to take an NSAID.  Vitamin B12 deficiency neuropathy (HCC)- I recommended that she continue receiving a B12 shot every month or 2.  Essential hypertension- Her blood pressure is well controlled.  Medical therapy is not  indicated.  Hyperlipidemia LDL goal <130- She is not willing to take a statin for CV risk reduction.   I have discontinued Cynthia Simpson's gabapentin, meloxicam, and rosuvastatin. I am also having her maintain her Omega-3 Fatty Acids (OMEGA 3 PO), Cholecalciferol (VITAMIN D PO), and zinc gluconate. We will stop administering cyanocobalamin.  No orders of the defined types were placed in this encounter.    Follow-up: No follow-ups on file.  Sanda Linger, MD

## 2019-12-08 NOTE — Patient Instructions (Signed)
Vitamin B12 Deficiency Vitamin B12 deficiency means that your body does not have enough vitamin B12. The body needs this vitamin:  To make red blood cells.  To make genes (DNA).  To help the nerves work. If you do not have enough vitamin B12 in your body, you can have health problems. What are the causes?  Not eating enough foods that contain vitamin B12.  Not being able to absorb vitamin B12 from the food that you eat.  Certain digestive system diseases.  A condition in which the body does not make enough of a certain protein, which results in too few red blood cells (pernicious anemia).  Having a surgery in which part of the stomach or small intestine is removed.  Taking medicines that make it hard for the body to absorb vitamin B12. These medicines include: ? Heartburn medicines. ? Some antibiotic medicines. ? Other medicines that are used to treat certain conditions. What increases the risk?  Being older than age 50.  Eating a vegetarian or vegan diet, especially while you are pregnant.  Eating a poor diet while you are pregnant.  Taking certain medicines.  Having alcoholism. What are the signs or symptoms? In some cases, there are no symptoms. If the condition leads to too few blood cells or nerve damage, symptoms can occur, such as:  Feeling weak.  Feeling tired (fatigued).  Not being hungry.  Weight loss.  A loss of feeling (numbness) or tingling in your hands and feet.  Redness and burning of the tongue.  Being mixed up (confused) or having memory problems.  Sadness (depression).  Problems with your senses. This can include color blindness, ringing in the ears, or loss of taste.  Watery poop (diarrhea) or trouble pooping (constipation).  Trouble walking. If anemia is very bad, symptoms can include:  Being short of breath.  Being dizzy.  Having a very fast heartbeat. How is this treated?  Changing the way you eat and drink, such  as: ? Eating more foods that contain vitamin B12. ? Drinking little or no alcohol.  Getting vitamin B12 shots.  Taking vitamin B12 supplements. Your doctor will tell you the dose that is best for you. Follow these instructions at home: Eating and drinking   Eat lots of healthy foods that contain vitamin B12. These include: ? Meats and poultry, such as beef, pork, chicken, turkey, and organ meats, such as liver. ? Seafood, such as clams, rainbow trout, salmon, tuna, and haddock. ? Eggs. ? Cereal and dairy products that have vitamin B12 added to them. Check the label. The items listed above may not be a complete list of what you can eat and drink. Contact a dietitian for more options. General instructions  Get any shots as told by your doctor.  Take supplements only as told by your doctor.  Do not drink alcohol if your doctor tells you not to. In some cases, you may only be asked to limit alcohol use.  Keep all follow-up visits as told by your doctor. This is important. Contact a doctor if:  Your symptoms come back. Get help right away if:  You have trouble breathing.  You have a very fast heartbeat.  You have chest pain.  You get dizzy.  You pass out. Summary  Vitamin B12 deficiency means that your body is not getting enough vitamin B12.  In some cases, there are no symptoms of this condition.  Treatment may include making a change in the way you eat and drink,   getting vitamin B12 shots, or taking supplements.  Eat lots of healthy foods that contain vitamin B12. This information is not intended to replace advice given to you by your health care provider. Make sure you discuss any questions you have with your health care provider. Document Revised: 11/04/2017 Document Reviewed: 11/04/2017 Elsevier Patient Education  2020 [employer]  

## 2019-12-24 ENCOUNTER — Other Ambulatory Visit: Payer: Self-pay

## 2019-12-24 ENCOUNTER — Ambulatory Visit (INDEPENDENT_AMBULATORY_CARE_PROVIDER_SITE_OTHER): Payer: Medicare PPO | Admitting: *Deleted

## 2019-12-24 DIAGNOSIS — E538 Deficiency of other specified B group vitamins: Secondary | ICD-10-CM | POA: Diagnosis not present

## 2019-12-24 DIAGNOSIS — G63 Polyneuropathy in diseases classified elsewhere: Secondary | ICD-10-CM | POA: Diagnosis not present

## 2019-12-24 MED ORDER — CYANOCOBALAMIN 1000 MCG/ML IJ SOLN
1000.0000 ug | Freq: Once | INTRAMUSCULAR | Status: AC
  Administered 2019-12-24: 1000 ug via INTRAMUSCULAR

## 2019-12-24 NOTE — Progress Notes (Signed)
Pls cosign for B12 inj in absence of PCP../lmb   

## 2019-12-27 ENCOUNTER — Ambulatory Visit: Payer: Medicare PPO

## 2020-01-13 ENCOUNTER — Other Ambulatory Visit: Payer: Self-pay | Admitting: Internal Medicine

## 2020-01-13 DIAGNOSIS — M170 Bilateral primary osteoarthritis of knee: Secondary | ICD-10-CM

## 2020-01-26 ENCOUNTER — Other Ambulatory Visit: Payer: Self-pay

## 2020-01-26 ENCOUNTER — Ambulatory Visit (INDEPENDENT_AMBULATORY_CARE_PROVIDER_SITE_OTHER): Payer: Medicare PPO

## 2020-01-26 DIAGNOSIS — E538 Deficiency of other specified B group vitamins: Secondary | ICD-10-CM

## 2020-01-26 DIAGNOSIS — I7381 Erythromelalgia: Secondary | ICD-10-CM

## 2020-01-26 DIAGNOSIS — M170 Bilateral primary osteoarthritis of knee: Secondary | ICD-10-CM

## 2020-01-26 DIAGNOSIS — G63 Polyneuropathy in diseases classified elsewhere: Secondary | ICD-10-CM

## 2020-01-26 MED ORDER — GABAPENTIN 100 MG PO CAPS: 100.0000 mg | ORAL_CAPSULE | Freq: Three times a day (TID) | ORAL | 1 refills | Status: DC

## 2020-01-26 MED ORDER — CYANOCOBALAMIN 1000 MCG/ML IJ SOLN
1000.0000 ug | Freq: Once | INTRAMUSCULAR | Status: AC
  Administered 2020-01-26: 1000 ug via INTRAMUSCULAR

## 2020-01-26 MED ORDER — MELOXICAM 7.5 MG PO TABS: 7.5000 mg | ORAL_TABLET | Freq: Every day | ORAL | 0 refills | Status: DC

## 2020-01-26 NOTE — Telephone Encounter (Signed)
Pt aware that requested meds have been refilled.

## 2020-01-26 NOTE — Telephone Encounter (Signed)
Pt in office for b12 nurse visit & would like refills.

## 2020-01-26 NOTE — Progress Notes (Signed)
Pt here for monthly B12 injection per Dr Jones.  B12 1000mcg given IM right deltoid and pt tolerated injection well.  Pt to scheduled next B12 injection upon check out.  

## 2020-02-25 ENCOUNTER — Ambulatory Visit (INDEPENDENT_AMBULATORY_CARE_PROVIDER_SITE_OTHER): Payer: Medicare PPO

## 2020-02-25 ENCOUNTER — Other Ambulatory Visit: Payer: Self-pay

## 2020-02-25 DIAGNOSIS — E538 Deficiency of other specified B group vitamins: Secondary | ICD-10-CM | POA: Diagnosis not present

## 2020-02-25 MED ORDER — CYANOCOBALAMIN 1000 MCG/ML IJ SOLN
1000.0000 ug | Freq: Once | INTRAMUSCULAR | Status: AC
  Administered 2020-02-25: 1000 ug via INTRAMUSCULAR

## 2020-02-25 NOTE — Progress Notes (Signed)
B12 injection given

## 2020-03-27 ENCOUNTER — Ambulatory Visit: Payer: Medicare PPO

## 2020-03-29 ENCOUNTER — Other Ambulatory Visit: Payer: Self-pay

## 2020-03-30 ENCOUNTER — Ambulatory Visit (INDEPENDENT_AMBULATORY_CARE_PROVIDER_SITE_OTHER): Payer: Medicare Other

## 2020-03-30 ENCOUNTER — Other Ambulatory Visit: Payer: Self-pay

## 2020-03-30 DIAGNOSIS — E538 Deficiency of other specified B group vitamins: Secondary | ICD-10-CM | POA: Diagnosis not present

## 2020-03-30 MED ORDER — CYANOCOBALAMIN 1000 MCG/ML IJ SOLN
1000.0000 ug | INTRAMUSCULAR | Status: AC
  Administered 2020-03-30 – 2020-10-06 (×6): 1000 ug via INTRAMUSCULAR

## 2020-03-30 NOTE — Progress Notes (Addendum)
Pt here for monthly B12 injection per  Dr Yetta Barre.  B12 given IM left deltoid and pt tolerated injection well.  Next B12 injection scheduled for 05/01/20.   I have reviewed and agree

## 2020-03-31 ENCOUNTER — Ambulatory Visit: Payer: Medicare Other

## 2020-05-01 ENCOUNTER — Ambulatory Visit (INDEPENDENT_AMBULATORY_CARE_PROVIDER_SITE_OTHER): Payer: Medicare Other

## 2020-05-01 ENCOUNTER — Other Ambulatory Visit: Payer: Self-pay

## 2020-05-01 DIAGNOSIS — E538 Deficiency of other specified B group vitamins: Secondary | ICD-10-CM

## 2020-05-01 MED ORDER — CYANOCOBALAMIN 1000 MCG/ML IJ SOLN
1000.0000 ug | Freq: Once | INTRAMUSCULAR | Status: AC
  Administered 2020-05-01: 1000 ug via INTRAMUSCULAR

## 2020-05-01 NOTE — Progress Notes (Signed)
b12 given  Please cosign 

## 2020-05-31 ENCOUNTER — Ambulatory Visit: Payer: Medicare Other

## 2020-06-02 ENCOUNTER — Other Ambulatory Visit: Payer: Self-pay

## 2020-06-02 ENCOUNTER — Ambulatory Visit (INDEPENDENT_AMBULATORY_CARE_PROVIDER_SITE_OTHER): Payer: Medicare Other

## 2020-06-02 DIAGNOSIS — E538 Deficiency of other specified B group vitamins: Secondary | ICD-10-CM

## 2020-06-02 NOTE — Progress Notes (Signed)
B12 given Please cosign 

## 2020-06-08 ENCOUNTER — Telehealth: Payer: Self-pay | Admitting: Internal Medicine

## 2020-06-08 NOTE — Telephone Encounter (Signed)
Pt is due for an OV per last OV note.  Please schedule. Thanks!

## 2020-06-08 NOTE — Telephone Encounter (Signed)
meloxicam (MOBIC) 7.5 MG tablet [id number] - [place], [place] - [address] AT [facility] OF [address] Phone:  [phone]  Fax:  [phone]     Last seen- 09.29.21 Next apt- n/a

## 2020-06-21 ENCOUNTER — Other Ambulatory Visit: Payer: Self-pay

## 2020-06-22 ENCOUNTER — Ambulatory Visit (INDEPENDENT_AMBULATORY_CARE_PROVIDER_SITE_OTHER): Payer: Medicare Other | Admitting: Internal Medicine

## 2020-06-22 ENCOUNTER — Other Ambulatory Visit: Payer: Self-pay

## 2020-06-22 ENCOUNTER — Encounter: Payer: Self-pay | Admitting: Internal Medicine

## 2020-06-22 VITALS — BP 160/88 | HR 100 | Temp 98.3°F | Resp 16 | Ht 66.0 in | Wt 127.2 lb

## 2020-06-22 DIAGNOSIS — R739 Hyperglycemia, unspecified: Secondary | ICD-10-CM

## 2020-06-22 DIAGNOSIS — R000 Tachycardia, unspecified: Secondary | ICD-10-CM | POA: Diagnosis not present

## 2020-06-22 DIAGNOSIS — Z23 Encounter for immunization: Secondary | ICD-10-CM | POA: Insufficient documentation

## 2020-06-22 DIAGNOSIS — E785 Hyperlipidemia, unspecified: Secondary | ICD-10-CM | POA: Diagnosis not present

## 2020-06-22 DIAGNOSIS — Z0001 Encounter for general adult medical examination with abnormal findings: Secondary | ICD-10-CM | POA: Insufficient documentation

## 2020-06-22 DIAGNOSIS — G63 Polyneuropathy in diseases classified elsewhere: Secondary | ICD-10-CM

## 2020-06-22 DIAGNOSIS — Z0000 Encounter for general adult medical examination without abnormal findings: Secondary | ICD-10-CM

## 2020-06-22 DIAGNOSIS — I10 Essential (primary) hypertension: Secondary | ICD-10-CM | POA: Diagnosis not present

## 2020-06-22 DIAGNOSIS — N1831 Chronic kidney disease, stage 3a: Secondary | ICD-10-CM

## 2020-06-22 DIAGNOSIS — E538 Deficiency of other specified B group vitamins: Secondary | ICD-10-CM

## 2020-06-22 LAB — HEPATIC FUNCTION PANEL
ALT: 12 U/L (ref 0–35)
AST: 20 U/L (ref 0–37)
Albumin: 4.2 g/dL (ref 3.5–5.2)
Alkaline Phosphatase: 49 U/L (ref 39–117)
Bilirubin, Direct: 0.1 mg/dL (ref 0.0–0.3)
Total Bilirubin: 0.5 mg/dL (ref 0.2–1.2)
Total Protein: 7.5 g/dL (ref 6.0–8.3)

## 2020-06-22 LAB — CBC WITH DIFFERENTIAL/PLATELET
Basophils Absolute: 0 10*3/uL (ref 0.0–0.1)
Basophils Relative: 0.5 % (ref 0.0–3.0)
Eosinophils Absolute: 0.1 10*3/uL (ref 0.0–0.7)
Eosinophils Relative: 2 % (ref 0.0–5.0)
HCT: 42.6 % (ref 36.0–46.0)
Hemoglobin: 14.6 g/dL (ref 12.0–15.0)
Lymphocytes Relative: 37.3 % (ref 12.0–46.0)
Lymphs Abs: 2.6 10*3/uL (ref 0.7–4.0)
MCHC: 34.2 g/dL (ref 30.0–36.0)
MCV: 94.4 fl (ref 78.0–100.0)
Monocytes Absolute: 0.6 10*3/uL (ref 0.1–1.0)
Monocytes Relative: 9.3 % (ref 3.0–12.0)
Neutro Abs: 3.5 10*3/uL (ref 1.4–7.7)
Neutrophils Relative %: 50.9 % (ref 43.0–77.0)
Platelets: 255 10*3/uL (ref 150.0–400.0)
RBC: 4.52 Mil/uL (ref 3.87–5.11)
RDW: 14.1 % (ref 11.5–15.5)
WBC: 6.9 10*3/uL (ref 4.0–10.5)

## 2020-06-22 LAB — LIPID PANEL
Cholesterol: 204 mg/dL — ABNORMAL HIGH (ref 0–200)
HDL: 83.3 mg/dL (ref >39.00)
LDL Cholesterol: 105 mg/dL — ABNORMAL HIGH (ref 0–99)
NonHDL: 121.04
Total CHOL/HDL Ratio: 2
Triglycerides: 78 mg/dL (ref 0.0–149.0)
VLDL: 15.6 mg/dL (ref 0.0–40.0)

## 2020-06-22 LAB — BASIC METABOLIC PANEL
BUN: 18 mg/dL (ref 6–23)
CO2: 26 mEq/L (ref 19–32)
Calcium: 10.1 mg/dL (ref 8.4–10.5)
Chloride: 102 mEq/L (ref 96–112)
Creatinine, Ser: 0.93 mg/dL (ref 0.40–1.20)
GFR: 59.66 mL/min — ABNORMAL LOW (ref >60.00)
Glucose, Bld: 110 mg/dL — ABNORMAL HIGH (ref 70–99)
Potassium: 3.6 mEq/L (ref 3.5–5.1)
Sodium: 137 mEq/L (ref 135–145)

## 2020-06-22 LAB — HEMOGLOBIN A1C: Hgb A1c MFr Bld: 5.3 % (ref 4.6–6.5)

## 2020-06-22 LAB — BRAIN NATRIURETIC PEPTIDE: Pro B Natriuretic peptide (BNP): 20 pg/mL (ref 0.0–100.0)

## 2020-06-22 LAB — FOLATE: Folate: 13.3 ng/mL (ref >5.9)

## 2020-06-22 LAB — TSH: TSH: 3.23 u[IU]/mL (ref 0.35–4.50)

## 2020-06-22 LAB — VITAMIN D 25 HYDROXY (VIT D DEFICIENCY, FRACTURES): VITD: 71.13 ng/mL (ref 30.00–100.00)

## 2020-06-22 NOTE — Patient Instructions (Signed)
Health Maintenance, Female Adopting a healthy lifestyle and getting preventive care are important in promoting health and wellness. Ask your health care provider about:  The right schedule for you to have regular tests and exams.  Things you can do on your own to prevent diseases and keep yourself healthy. What should I know about diet, weight, and exercise? Eat a healthy diet  Eat a diet that includes plenty of vegetables, fruits, low-fat dairy products, and lean protein.  Do not eat a lot of foods that are high in solid fats, added sugars, or sodium.   Maintain a healthy weight Body mass index (BMI) is used to identify weight problems. It estimates body fat based on height and weight. Your health care provider can help determine your BMI and help you achieve or maintain a healthy weight. Get regular exercise Get regular exercise. This is one of the most important things you can do for your health. Most adults should:  Exercise for at least 150 minutes each week. The exercise should increase your heart rate and make you sweat (moderate-intensity exercise).  Do strengthening exercises at least twice a week. This is in addition to the moderate-intensity exercise.  Spend less time sitting. Even light physical activity can be beneficial. Watch cholesterol and blood lipids Have your blood tested for lipids and cholesterol at 77 years of age, then have this test every 5 years. Have your cholesterol levels checked more often if:  Your lipid or cholesterol levels are high.  You are older than 77 years of age.  You are at high risk for heart disease. What should I know about cancer screening? Depending on your health history and family history, you may need to have cancer screening at various ages. This may include screening for:  Breast cancer.  Cervical cancer.  Colorectal cancer.  Skin cancer.  Lung cancer. What should I know about heart disease, diabetes, and high blood  pressure? Blood pressure and heart disease  High blood pressure causes heart disease and increases the risk of stroke. This is more likely to develop in people who have high blood pressure readings, are of African descent, or are overweight.  Have your blood pressure checked: ? Every 3-5 years if you are 18-39 years of age. ? Every year if you are 40 years old or older. Diabetes Have regular diabetes screenings. This checks your fasting blood sugar level. Have the screening done:  Once every three years after age 40 if you are at a normal weight and have a low risk for diabetes.  More often and at a younger age if you are overweight or have a high risk for diabetes. What should I know about preventing infection? Hepatitis B If you have a higher risk for hepatitis B, you should be screened for this virus. Talk with your health care provider to find out if you are at risk for hepatitis B infection. Hepatitis C Testing is recommended for:  Everyone born from 1945 through 1965.  Anyone with known risk factors for hepatitis C. Sexually transmitted infections (STIs)  Get screened for STIs, including gonorrhea and chlamydia, if: ? You are sexually active and are younger than 77 years of age. ? You are older than 77 years of age and your health care provider tells you that you are at risk for this type of infection. ? Your sexual activity has changed since you were last screened, and you are at increased risk for chlamydia or gonorrhea. Ask your health care provider   if you are at risk.  Ask your health care provider about whether you are at high risk for HIV. Your health care provider may recommend a prescription medicine to help prevent HIV infection. If you choose to take medicine to prevent HIV, you should first get tested for HIV. You should then be tested every 3 months for as long as you are taking the medicine. Pregnancy  If you are about to stop having your period (premenopausal) and  you may become pregnant, seek counseling before you get pregnant.  Take 400 to 800 micrograms (mcg) of folic acid every day if you become pregnant.  Ask for birth control (contraception) if you want to prevent pregnancy. Osteoporosis and menopause Osteoporosis is a disease in which the bones lose minerals and strength with aging. This can result in bone fractures. If you are 65 years old or older, or if you are at risk for osteoporosis and fractures, ask your health care provider if you should:  Be screened for bone loss.  Take a calcium or vitamin D supplement to lower your risk of fractures.  Be given hormone replacement therapy (HRT) to treat symptoms of menopause. Follow these instructions at home: Lifestyle  Do not use any products that contain nicotine or tobacco, such as cigarettes, e-cigarettes, and chewing tobacco. If you need help quitting, ask your health care provider.  Do not use street drugs.  Do not share needles.  Ask your health care provider for help if you need support or information about quitting drugs. Alcohol use  Do not drink alcohol if: ? Your health care provider tells you not to drink. ? You are pregnant, may be pregnant, or are planning to become pregnant.  If you drink alcohol: ? Limit how much you use to 0-1 drink a day. ? Limit intake if you are breastfeeding.  Be aware of how much alcohol is in your drink. In the [place] one drink equals one 12 oz bottle of beer (355 mL), one 5 oz glass of wine (148 mL), or one 1 oz glass of hard liquor (44 mL). General instructions  Schedule regular health, dental, and eye exams.  Stay current with your vaccines.  Tell your health care provider if: ? You often feel depressed. ? You have ever been abused or do not feel safe at home. Summary  Adopting a healthy lifestyle and getting preventive care are important in promoting health and wellness.  Follow your health care provider's instructions about healthy  diet, exercising, and getting tested or screened for diseases.  Follow your health care provider's instructions on monitoring your cholesterol and blood pressure. This information is not intended to replace advice given to you by your health care provider. Make sure you discuss any questions you have with your health care provider. Document Revised: 02/18/2018 Document Reviewed: 02/18/2018 Elsevier Patient Education  2021 Elsevier Inc.  

## 2020-06-22 NOTE — Progress Notes (Signed)
Subjective:  Patient ID: Cynthia Simpson, female    DOB: 04/19/1945  Age: 75 y.o. MRN: [id number]  CC: Annual Exam, Hypertension, and Hyperlipidemia  This visit occurred during the SARS-CoV-2 public health emergency.  Safety protocols were in place, including screening questions prior to the visit, additional usage of staff PPE, and extensive cleaning of exam room while observing appropriate contact time as indicated for disinfecting solutions.    HPI Cynthia Simpson presents for a CPX.  She walks about 5 miles a day and does not experience CP, DOE, dizziness, lightheadedness, edema, or claudication.  She continues to complain of a discomfort in her lower legs that she describes as a pulling sensation that occurs at rest.  She has had no symptom relief with gabapentin and meloxicam and would like to stop taking both of these.  She tells me her blood pressure at home is very well controlled but when she comes in for an office visit she gets very anxious and she says her blood pressure and her pulse both rise.  Outpatient Medications Prior to Visit  Medication Sig Dispense Refill  . Cholecalciferol (VITAMIN D PO) Take 2 capsules by mouth daily.    . Omega-3 Fatty Acids (OMEGA 3 PO) Take 2 capsules by mouth daily.    Marland Kitchen. zinc gluconate 50 MG tablet Take 50 mg by mouth daily.    Marland Kitchen. gabapentin (NEURONTIN) 100 MG capsule Take 1 capsule (100 mg total) by mouth 3 (three) times daily. (Patient not taking: Reported on 06/22/2020) 270 capsule 1  . meloxicam (MOBIC) 7.5 MG tablet Take 1 tablet (7.5 mg total) by mouth daily. (Patient not taking: Reported on 06/22/2020) 90 tablet 0   Facility-Administered Medications Prior to Visit  Medication Dose Route Frequency Provider Last Rate Last Admin  . cyanocobalamin ((VITAMIN B-12)) injection 1,000 mcg  1,000 mcg Intramuscular Q30 days Etta GrandchildJones, Vincie Linn L, MD   1,000 mcg at 06/02/20 0933    ROS Review of Systems  Constitutional: Negative.  Negative for  diaphoresis, fatigue and fever.  HENT: Negative.   Eyes: Negative.   Respiratory: Negative for cough, chest tightness and shortness of breath.   Cardiovascular: Negative for chest pain, palpitations and leg swelling.  Gastrointestinal: Negative for abdominal pain, constipation, diarrhea, nausea and vomiting.  Endocrine: Negative.   Genitourinary: Negative.  Negative for difficulty urinating.  Musculoskeletal: Positive for arthralgias. Negative for back pain and myalgias.  Skin: Negative.   Neurological: Negative.  Negative for dizziness, weakness, light-headedness, numbness and headaches.  Hematological: Negative for adenopathy. Does not bruise/bleed easily.  Psychiatric/Behavioral: Negative for dysphoric mood and sleep disturbance. The patient is nervous/anxious.     Objective:  BP (!) 160/88 (BP Location: Right Arm, Patient Position: Sitting, Cuff Size: Normal)   Pulse 100   Temp 98.3 F (36.8 C) (Oral)   Resp 16   Ht 5\' 6"  (1.676 m)   Wt 127 lb 3.2 oz (57.7 kg)   SpO2 97%   BMI 20.53 kg/m   BP Readings from Last 3 Encounters:  06/22/20 (!) 160/88  12/08/19 138/84  05/17/19 138/86    Wt Readings from Last 3 Encounters:  06/22/20 127 lb 3.2 oz (57.7 kg)  12/08/19 127 lb (57.6 kg)  05/17/19 126 lb (57.2 kg)    Physical Exam Vitals reviewed.  HENT:     Mouth/Throat:     Mouth: Mucous membranes are moist.  Eyes:     General: No scleral icterus.    Conjunctiva/sclera: Conjunctivae normal.  Cardiovascular:  Rate and Rhythm: Regular rhythm. Tachycardia present.     Heart sounds: Normal heart sounds, S1 normal and S2 normal. No murmur heard. No gallop.      Comments: EKG- Sinus tachycardia, 107 bpm Otherwise normal EKG Pulmonary:     Effort: Pulmonary effort is normal.     Breath sounds: No stridor. No wheezing, rhonchi or rales.  Abdominal:     General: Abdomen is flat.     Palpations: There is no mass.     Tenderness: There is no abdominal tenderness. There  is no guarding.     Hernia: No hernia is present.  Musculoskeletal:     Cervical back: Neck supple.     Right lower leg: No edema.     Left lower leg: No edema.  Lymphadenopathy:     Cervical: No cervical adenopathy.  Skin:    General: Skin is warm and dry.     Coloration: Skin is not pale.  Neurological:     General: No focal deficit present.     Mental Status: She is oriented to person, place, and time. Mental status is at baseline.  Psychiatric:        Attention and Perception: She is inattentive.        Mood and Affect: Mood is anxious. Affect is labile.        Speech: Speech is tangential. Speech is not delayed.        Behavior: Behavior normal.        Thought Content: Thought content normal. Thought content is not paranoid. Thought content does not include homicidal or suicidal ideation.        Cognition and Memory: Cognition normal.     Lab Results  Component Value Date   WBC 6.9 06/22/2020   HGB 14.6 06/22/2020   HCT 42.6 06/22/2020   PLT 255.0 06/22/2020   GLUCOSE 110 (H) 06/22/2020   CHOL 204 (H) 06/22/2020   TRIG 78.0 06/22/2020   HDL 83.30 06/22/2020   LDLCALC 105 (H) 06/22/2020   ALT 12 06/22/2020   AST 20 06/22/2020   NA 137 06/22/2020   K 3.6 06/22/2020   CL 102 06/22/2020   CREATININE 0.93 06/22/2020   BUN 18 06/22/2020   CO2 26 06/22/2020   TSH 3.23 06/22/2020   HGBA1C 5.3 06/22/2020    VAS [place] ABI WITH/WO TBI  Result Date: 05/28/2019 LOWER EXTREMITY DOPPLER STUDY Indications: Bilateral leg pain . High Risk Factors: Hyperlipidemia. Other Factors: History of walking 5 miles. Redish toes bilaterally.  Comparison Study: No prior exam Performing Technologist: Elita Quick RVT  Examination Guidelines: A complete evaluation includes at minimum, Doppler waveform signals and systolic blood pressure reading at the level of bilateral brachial, anterior tibial, and posterior tibial arteries, when vessel segments are accessible. Bilateral testing is considered an  integral part of a complete examination. Photoelectric Plethysmograph (PPG) waveforms and toe systolic pressure readings are included as required and additional duplex testing as needed. Limited examinations for reoccurring indications may be performed as noted.  ABI Findings: +---------+------------------+-----+--------+--------+ Right    Rt Pressure (mmHg)IndexWaveformComment  +---------+------------------+-----+--------+--------+ Brachial 158                                     +---------+------------------+-----+--------+--------+ PTA      175               1.11 biphasic         +---------+------------------+-----+--------+--------+  DP       165               1.04 biphasic         +---------+------------------+-----+--------+--------+ Great Toe124               0.78 Normal           +---------+------------------+-----+--------+--------+ +---------+------------------+-----+--------+-------+ Left     Lt Pressure (mmHg)IndexWaveformComment +---------+------------------+-----+--------+-------+ Brachial 153                                    +---------+------------------+-----+--------+-------+ PTA      176               1.11 biphasic        +---------+------------------+-----+--------+-------+ DP       155               0.98 biphasic        +---------+------------------+-----+--------+-------+ Great Toe110               0.70 Normal          +---------+------------------+-----+--------+-------+ +-------+-----------+-----------+------------+------------+ ABI/TBIToday's ABIToday's TBIPrevious ABIPrevious TBI +-------+-----------+-----------+------------+------------+ Right  1.11       0.78                                +-------+-----------+-----------+------------+------------+ Left   1.11       0.70                                +-------+-----------+-----------+------------+------------+  Summary: Right: Resting right ankle-brachial index  is within normal range. No evidence of significant right lower extremity arterial disease. The right toe-brachial index is normal. Left: Resting left ankle-brachial index is within normal range. No evidence of significant left lower extremity arterial disease. The left toe-brachial index is normal.  *See table(s) above for measurements and observations.  Electronically signed by Lemar Livings MD on 05/28/2019 at 10:58:43 AM.    Final     Assessment & Plan:   Mariella was seen today for annual exam, hypertension and hyperlipidemia.  Diagnoses and all orders for this visit:  Tachycardia- The work-up for secondary causes is unremarkable.  Her EKG shows sinus tachycardia.  I think this is the whitecoat phenomenon and does not need to be treated. -     EKG 12-Lead -     CBC with Differential/Platelet; Future -     Brain natriuretic peptide; Future -     TSH; Future -     VITAMIN D 25 Hydroxy (Vit-D Deficiency, Fractures); Future -     VITAMIN D 25 Hydroxy (Vit-D Deficiency, Fractures) -     TSH -     Brain natriuretic peptide -     CBC with Differential/Platelet  Need for vaccination -     Pneumococcal conjugate vaccine 20-valent  Essential hypertension- She tells me her blood pressure at home is in the 130/80 range.  I think the elevated blood pressure and pulse here are both the whitecoat phenomenon.  She would prefer not to take an antihypertensive. -     CBC with Differential/Platelet; Future -     Basic metabolic panel; Future -     TSH; Future -     Urinalysis, Routine w reflex microscopic; Future -     VITAMIN D  25 Hydroxy (Vit-D Deficiency, Fractures); Future -     VITAMIN D 25 Hydroxy (Vit-D Deficiency, Fractures) -     Urinalysis, Routine w reflex microscopic -     TSH -     Basic metabolic panel -     CBC with Differential/Platelet  Vitamin B12 deficiency neuropathy (HCC)- She will continue parenteral B12 replacement therapy. -     Folate; Future -     Folate  Hyperlipidemia  LDL goal <130- She has an elevated ASCVD risk score so I have asked her to take a statin for CV risk reduction. -     Lipid panel; Future -     TSH; Future -     Hepatic function panel; Future -     Hepatic function panel -     TSH -     Lipid panel -     atorvastatin (LIPITOR) 10 MG tablet; Take 1 tablet (10 mg total) by mouth daily.  Encounter for general adult medical examination with abnormal findings- Exam completed, labs reviewed, vaccines reviewed and updated, patient education material was given.  Hyperglycemia- Her A1c is normal. -     Hemoglobin A1c; Future -     Hemoglobin A1c  Stage 3a chronic kidney disease (HCC)- Will discontinue NSAIDs.   I have discontinued Cynthia Simpson's meloxicam and gabapentin. I am also having her start on atorvastatin. Additionally, I am having her maintain her Omega-3 Fatty Acids (OMEGA 3 PO), Cholecalciferol (VITAMIN D PO), and zinc gluconate. We will continue to administer cyanocobalamin.  Meds ordered this encounter  Medications  . atorvastatin (LIPITOR) 10 MG tablet    Sig: Take 1 tablet (10 mg total) by mouth daily.    Dispense:  90 tablet    Refill:  1     Follow-up: Return in about 6 months (around 12/22/2020).  Sanda Linger, MD

## 2020-06-23 DIAGNOSIS — N1831 Chronic kidney disease, stage 3a: Secondary | ICD-10-CM | POA: Insufficient documentation

## 2020-06-23 MED ORDER — ATORVASTATIN CALCIUM 10 MG PO TABS: 10.0000 mg | ORAL_TABLET | Freq: Every day | ORAL | 1 refills | Status: DC

## 2020-06-26 ENCOUNTER — Encounter: Payer: Self-pay | Admitting: Internal Medicine

## 2020-06-30 ENCOUNTER — Other Ambulatory Visit: Payer: Self-pay

## 2020-07-03 ENCOUNTER — Ambulatory Visit (INDEPENDENT_AMBULATORY_CARE_PROVIDER_SITE_OTHER): Payer: Medicare Other

## 2020-07-03 ENCOUNTER — Other Ambulatory Visit: Payer: Self-pay

## 2020-07-03 DIAGNOSIS — G63 Polyneuropathy in diseases classified elsewhere: Secondary | ICD-10-CM | POA: Diagnosis not present

## 2020-07-03 DIAGNOSIS — E538 Deficiency of other specified B group vitamins: Secondary | ICD-10-CM

## 2020-07-03 NOTE — Progress Notes (Signed)
Pt here for monthly B12 injection per Dr. Jones  B12 1000mcg given right deltoid IM, and pt tolerated injection well.  Next B12 injection scheduled for 08/02/20  

## 2020-07-14 ENCOUNTER — Telehealth: Payer: Self-pay | Admitting: Internal Medicine

## 2020-07-14 NOTE — Telephone Encounter (Signed)
Patient states she was supposed to watch her BP and has noticed it has been moderately elevated so she is wondering if she needs to try a BP medication.

## 2020-07-15 ENCOUNTER — Other Ambulatory Visit: Payer: Self-pay | Admitting: Internal Medicine

## 2020-07-15 DIAGNOSIS — R000 Tachycardia, unspecified: Secondary | ICD-10-CM

## 2020-07-15 DIAGNOSIS — I10 Essential (primary) hypertension: Secondary | ICD-10-CM

## 2020-07-15 MED ORDER — NEBIVOLOL HCL 5 MG PO TABS: 5.0000 mg | ORAL_TABLET | Freq: Every day | ORAL | 1 refills | Status: DC

## 2020-07-15 NOTE — Telephone Encounter (Signed)
Yes, RX sent 

## 2020-07-17 NOTE — Telephone Encounter (Signed)
Pt has been informed that Rx was sent.  

## 2020-08-02 ENCOUNTER — Ambulatory Visit (INDEPENDENT_AMBULATORY_CARE_PROVIDER_SITE_OTHER): Payer: Medicare Other

## 2020-08-02 ENCOUNTER — Other Ambulatory Visit: Payer: Self-pay

## 2020-08-02 DIAGNOSIS — E538 Deficiency of other specified B group vitamins: Secondary | ICD-10-CM

## 2020-08-02 DIAGNOSIS — H5213 Myopia, bilateral: Secondary | ICD-10-CM | POA: Diagnosis not present

## 2020-08-02 NOTE — Progress Notes (Signed)
B12 Given 

## 2020-08-05 IMAGING — DX DG TIBIA/FIBULA 2V*R*
4 series · 4 of 4 positions shown · non-contrast
Comparison: None

CLINICAL DATA: Right leg pain.  No known injury.

EXAM:
RIGHT TIBIA AND FIBULA - 2 VIEW

[tibia ap (1 of 2)]
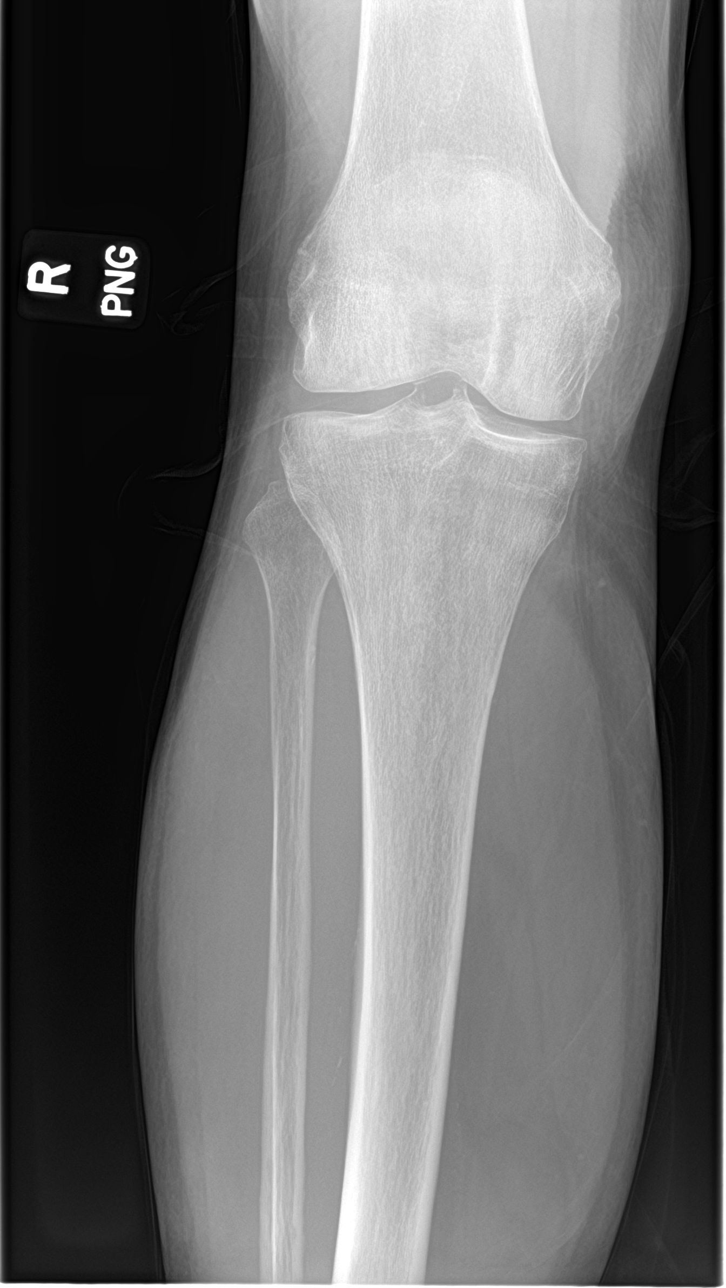

[tibia ap (2 of 2)]
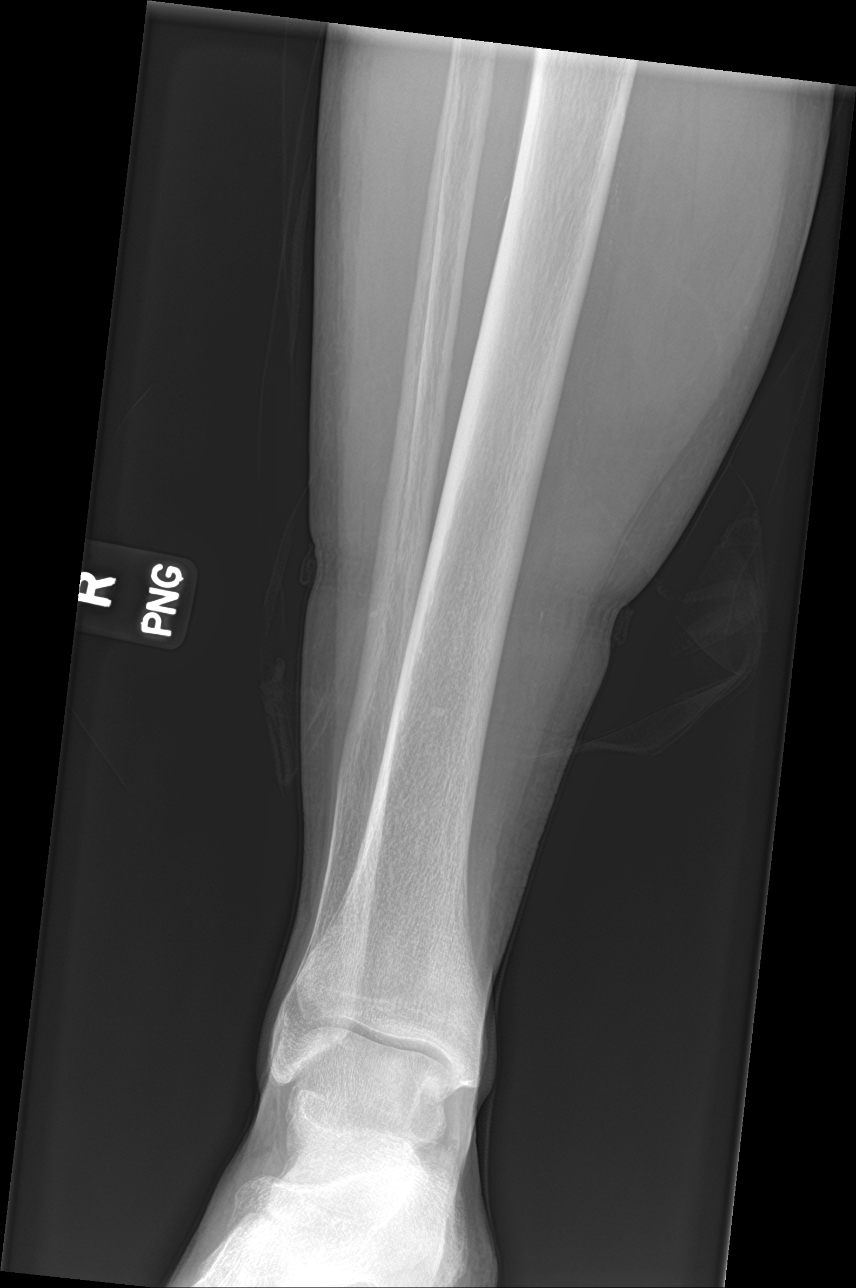

[tibia lat (1 of 2)]
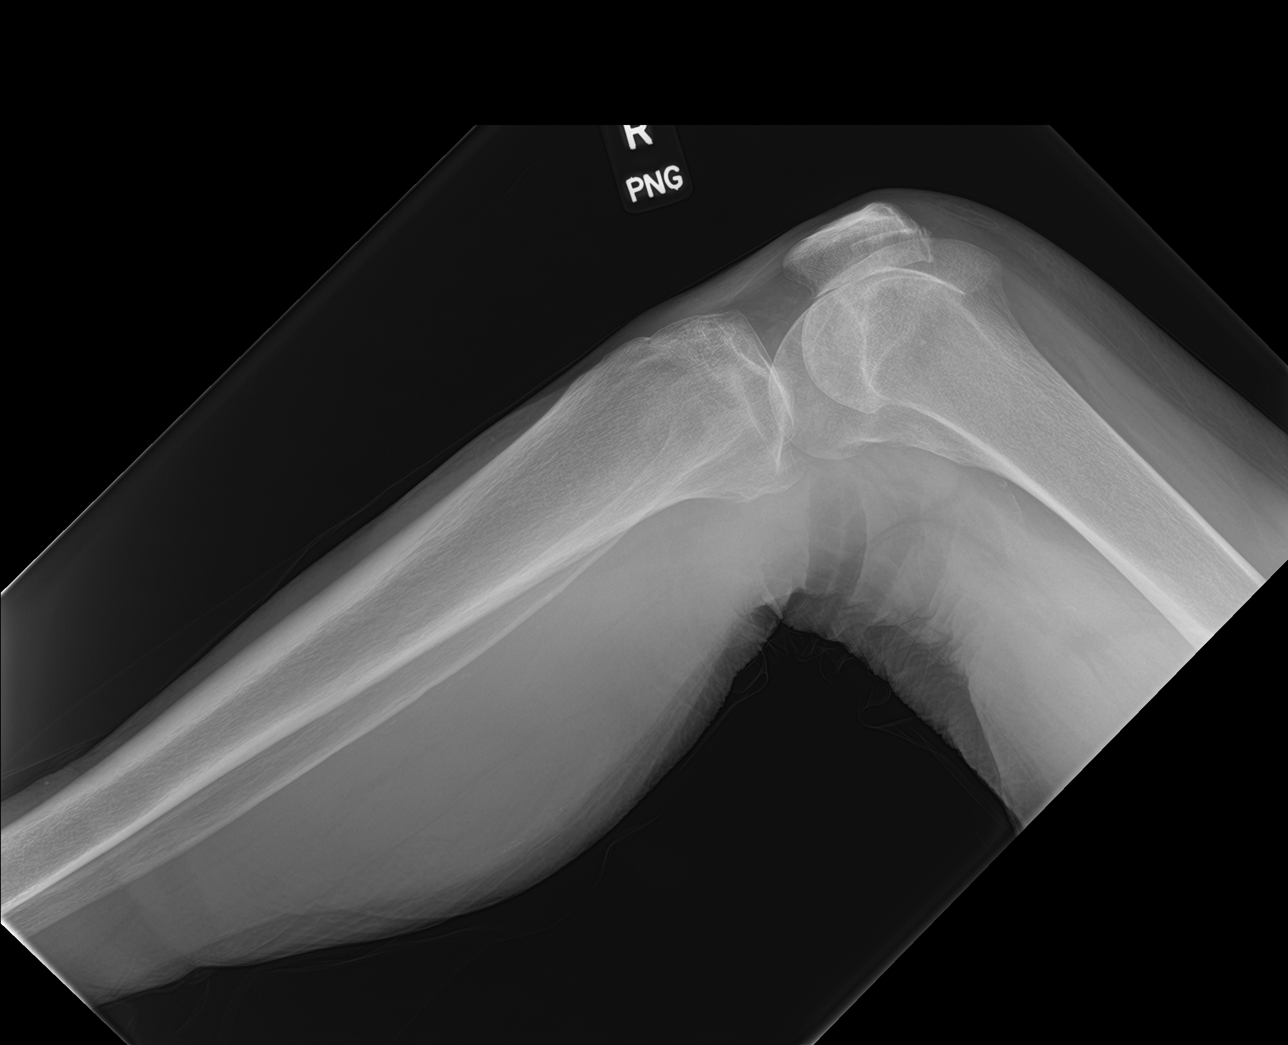

[tibia lat (2 of 2)]
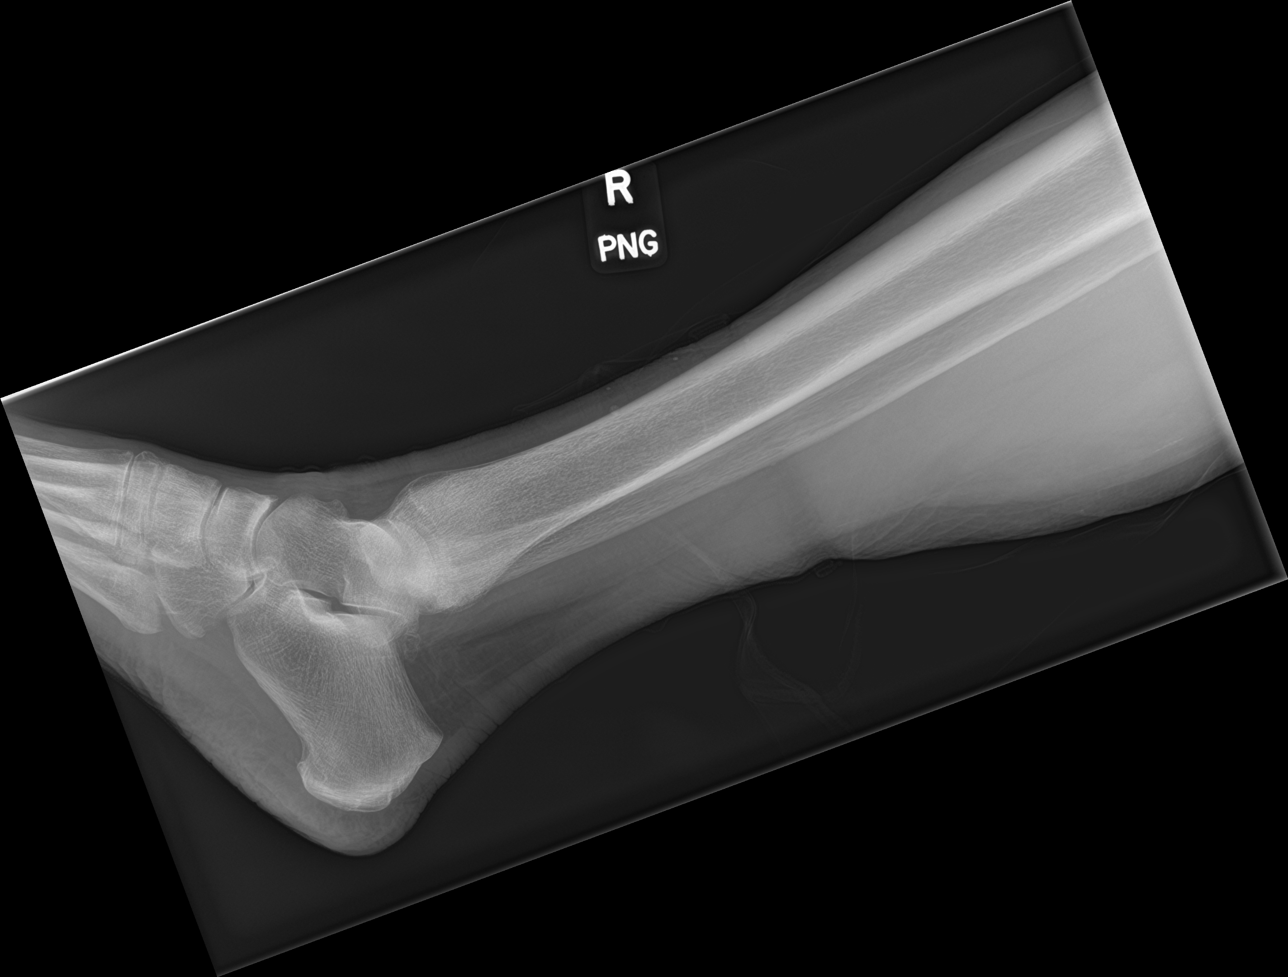

[4 of 4 positions shown; findings below may reference images not displayed]

FINDINGS: Slight joint space narrowing within the right knee joint. No joint
effusion. No acute bony abnormality. Specifically, no fracture,
subluxation, or dislocation. Soft tissues intact.
IMPRESSION: No acute bony abnormality.

## 2020-08-05 IMAGING — DX DG FEMUR 2+V*R*
4 series · 4 of 4 positions shown · non-contrast
Comparison: None.

CLINICAL DATA: Right upper leg pain.  No known injury.

EXAM:
RIGHT FEMUR 2 VIEWS

[femur ap (1 of 2)]
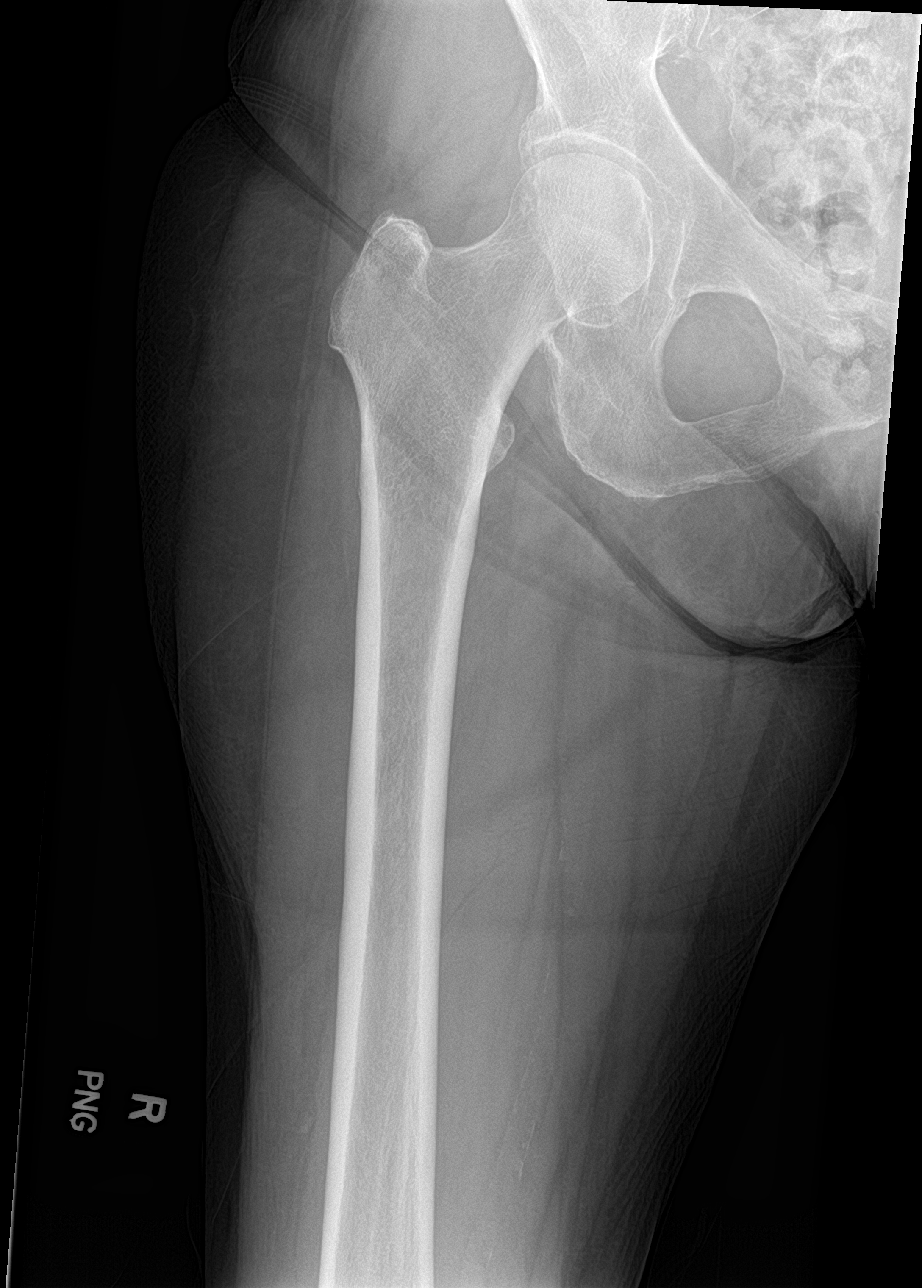

[femur ap (2 of 2)]
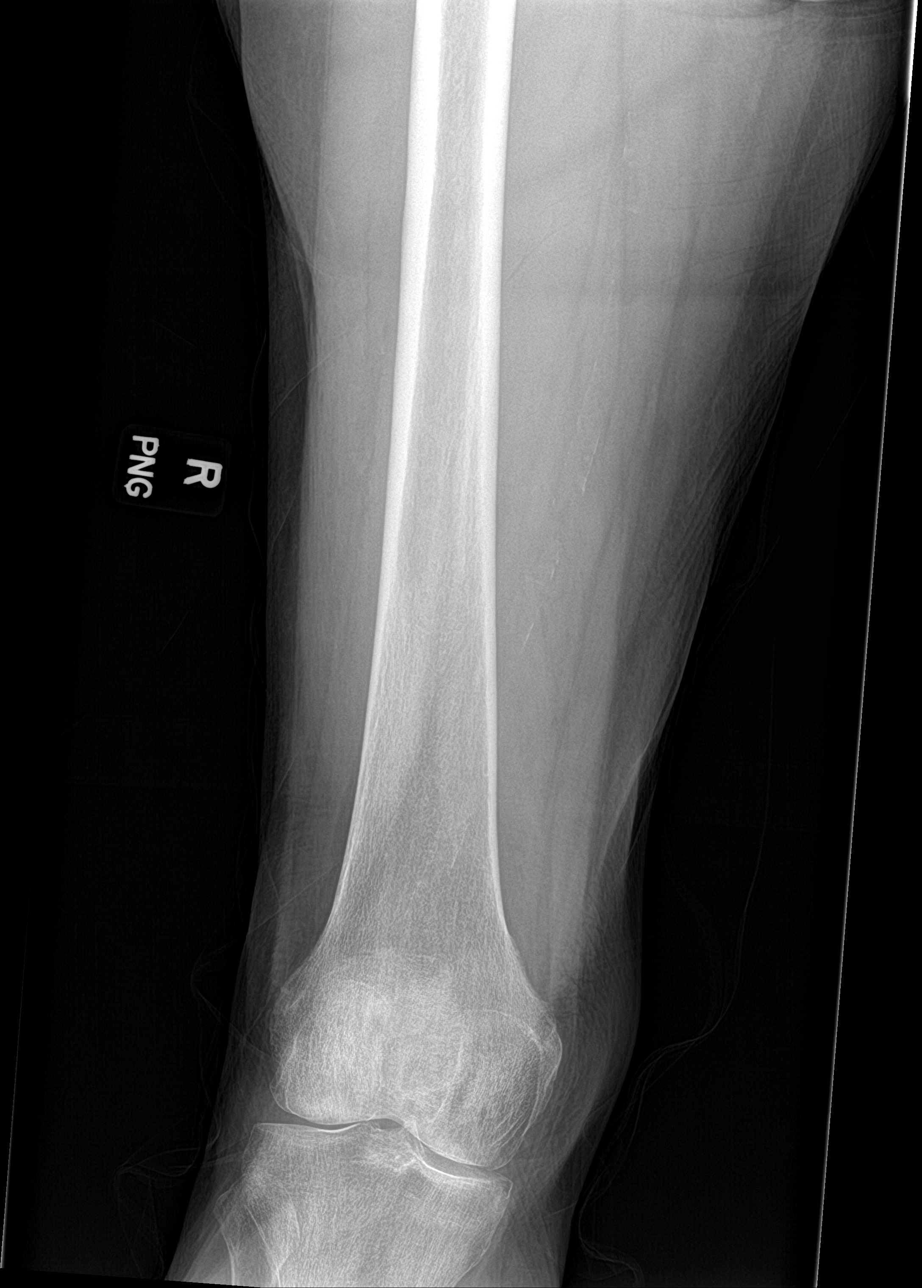

[femur lat (1 of 2)]
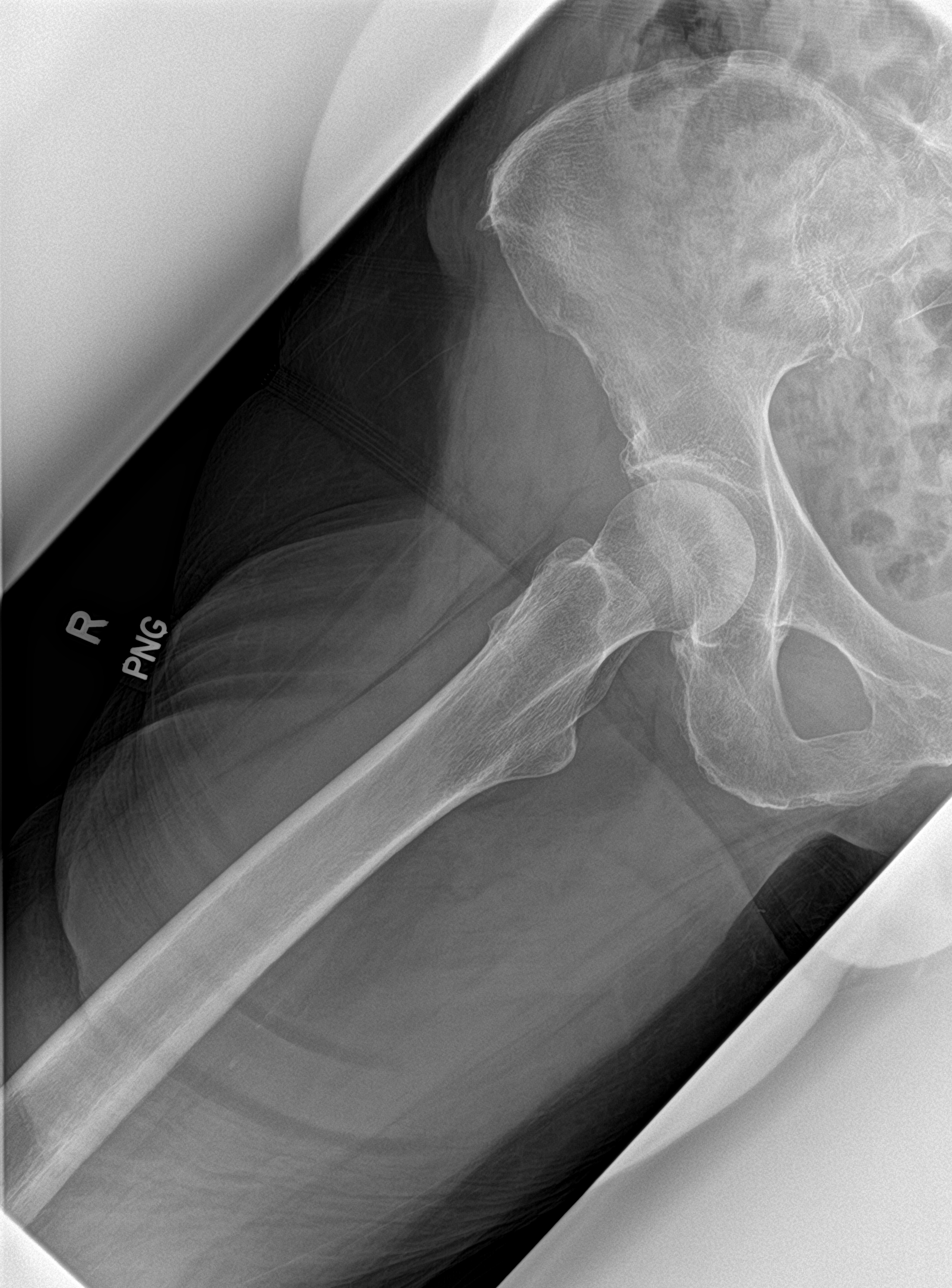

[femur lat (2 of 2)]
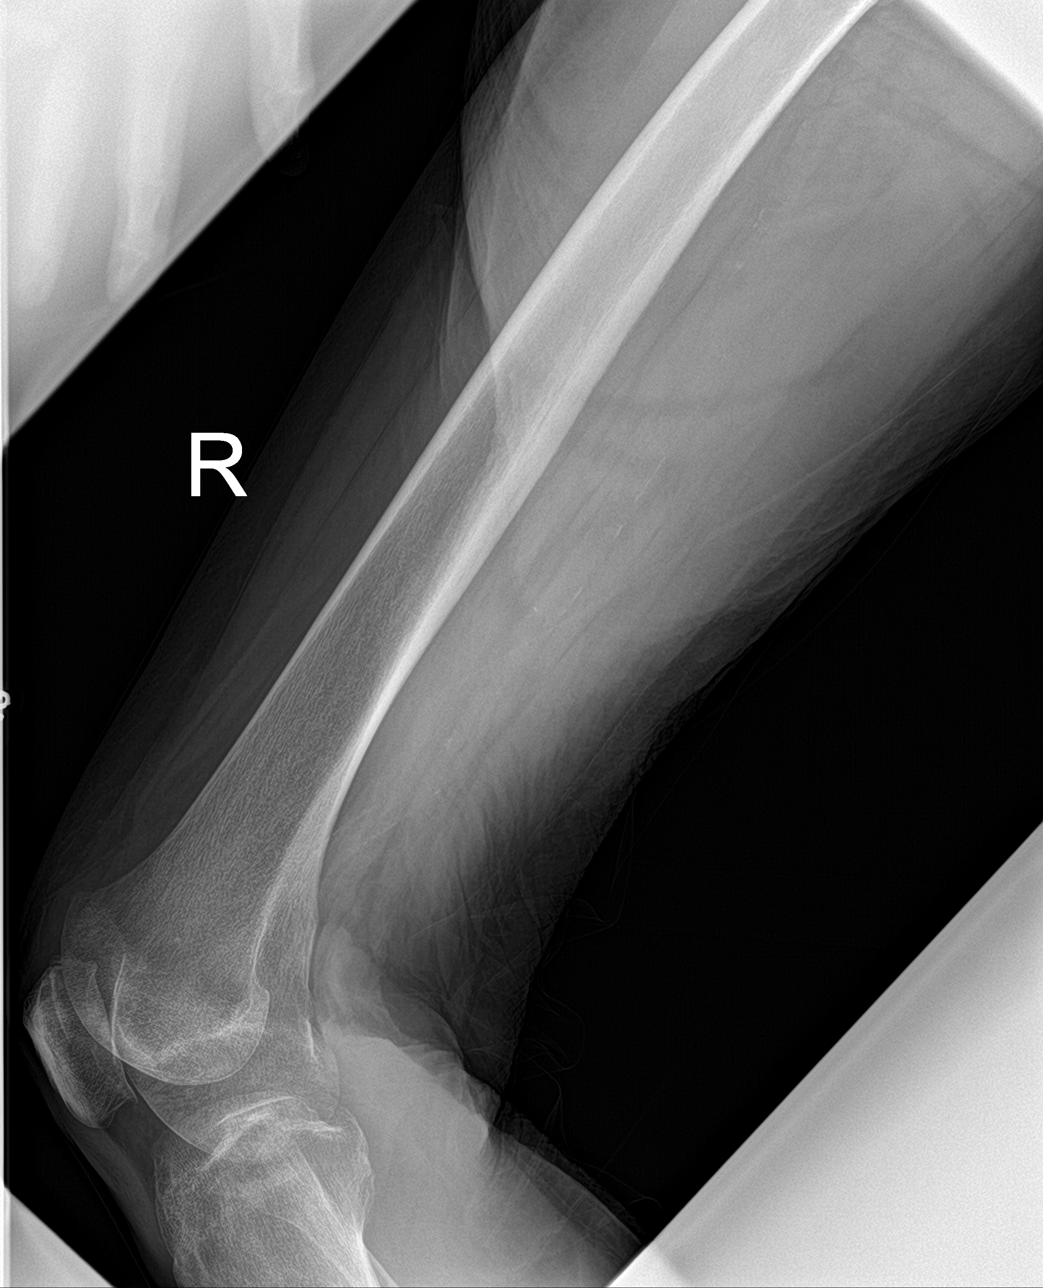

[4 of 4 positions shown; findings below may reference images not displayed]

FINDINGS: No acute bony abnormality. Specifically, no fracture, subluxation,
or dislocation. Joint space narrowing within the knee joint. No
visible joint effusion.
IMPRESSION: No acute bony abnormality. Mild degenerative changes in the right
knee joint.

## 2020-08-10 ENCOUNTER — Other Ambulatory Visit: Payer: Self-pay

## 2020-08-10 ENCOUNTER — Encounter: Payer: Self-pay | Admitting: Internal Medicine

## 2020-08-10 ENCOUNTER — Ambulatory Visit (INDEPENDENT_AMBULATORY_CARE_PROVIDER_SITE_OTHER): Payer: Medicare Other | Admitting: Internal Medicine

## 2020-08-10 VITALS — BP 144/86 | HR 100 | Temp 98.8°F | Resp 16 | Ht 66.0 in | Wt 128.0 lb

## 2020-08-10 DIAGNOSIS — I10 Essential (primary) hypertension: Secondary | ICD-10-CM | POA: Diagnosis not present

## 2020-08-10 DIAGNOSIS — R000 Tachycardia, unspecified: Secondary | ICD-10-CM | POA: Diagnosis not present

## 2020-08-10 DIAGNOSIS — Z23 Encounter for immunization: Secondary | ICD-10-CM

## 2020-08-10 MED ORDER — SHINGRIX 50 MCG/0.5ML IM SUSR: 0.5000 mL | Freq: Once | INTRAMUSCULAR | 1 refills | Status: AC

## 2020-08-10 NOTE — Progress Notes (Signed)
Subjective:  Patient ID: Cynthia Simpson, female    DOB: 09/09/1945  Age: 75 y.o. MRN: [id number]  CC: Hypertension  This visit occurred during the SARS-CoV-2 public health emergency.  Safety protocols were in place, including screening questions prior to the visit, additional usage of staff PPE, and extensive cleaning of exam room while observing appropriate contact time as indicated for disinfecting solutions.    HPI Cynthia Simpson presents for f/up - She has felt well recently and tells me that her BP has been well controlled.  Outpatient Medications Prior to Visit  Medication Sig Dispense Refill  . Cholecalciferol (VITAMIN D PO) Take 2 capsules by mouth daily.    . nebivolol (BYSTOLIC) 5 MG tablet Take 1 tablet (5 mg total) by mouth daily. 90 tablet 1  . Omega-3 Fatty Acids (OMEGA 3 PO) Take 2 capsules by mouth daily.    Marland Kitchen atorvastatin (LIPITOR) 10 MG tablet Take 1 tablet (10 mg total) by mouth daily. 90 tablet 1  . zinc gluconate 50 MG tablet Take 50 mg by mouth daily.     Facility-Administered Medications Prior to Visit  Medication Dose Route Frequency Provider Last Rate Last Admin  . cyanocobalamin ((VITAMIN B-12)) injection 1,000 mcg  1,000 mcg Intramuscular Q30 days Etta Grandchild, MD   1,000 mcg at 08/02/20 0814    ROS Review of Systems  Constitutional: Negative for diaphoresis, fatigue and unexpected weight change.  HENT: Negative.   Eyes: Negative.   Respiratory: Negative for chest tightness, shortness of breath and wheezing.   Cardiovascular: Negative for chest pain, palpitations and leg swelling.  Gastrointestinal: Negative for abdominal pain, constipation and diarrhea.  Endocrine: Negative.   Genitourinary: Negative.  Negative for difficulty urinating.  Musculoskeletal: Positive for arthralgias. Negative for myalgias.  Skin: Negative.  Negative for color change.  Neurological: Negative.  Negative for dizziness, weakness and light-headedness.   Hematological: Negative for adenopathy. Does not bruise/bleed easily.  Psychiatric/Behavioral: Negative.     Objective:  BP (!) 144/86 (BP Location: Left Arm, Patient Position: Sitting, Cuff Size: Normal)   Pulse 100   Temp 98.8 F (37.1 C) (Oral)   Resp 16   Ht 5\' 6"  (1.676 m)   Wt 128 lb (58.1 kg)   SpO2 97%   BMI 20.66 kg/m   BP Readings from Last 3 Encounters:  08/10/20 (!) 144/86  06/22/20 (!) 160/88  12/08/19 138/84    Wt Readings from Last 3 Encounters:  08/10/20 128 lb (58.1 kg)  06/22/20 127 lb 3.2 oz (57.7 kg)  12/08/19 127 lb (57.6 kg)    Physical Exam Vitals reviewed.  HENT:     Nose: Nose normal.  Eyes:     General: No scleral icterus.    Conjunctiva/sclera: Conjunctivae normal.  Cardiovascular:     Rate and Rhythm: Normal rate and regular rhythm.     Heart sounds: No murmur heard.   Pulmonary:     Effort: Pulmonary effort is normal.     Breath sounds: No stridor. No wheezing, rhonchi or rales.  Abdominal:     General: Abdomen is flat.     Palpations: There is no mass.     Tenderness: There is no abdominal tenderness. There is no guarding.  Musculoskeletal:        General: Normal range of motion.     Cervical back: Neck supple.  Lymphadenopathy:     Cervical: No cervical adenopathy.  Skin:    General: Skin is warm and dry.  Neurological:  General: No focal deficit present.     Mental Status: She is alert.     Lab Results  Component Value Date   WBC 6.9 06/22/2020   HGB 14.6 06/22/2020   HCT 42.6 06/22/2020   PLT 255.0 06/22/2020   GLUCOSE 110 (H) 06/22/2020   CHOL 204 (H) 06/22/2020   TRIG 78.0 06/22/2020   HDL 83.30 06/22/2020   LDLCALC 105 (H) 06/22/2020   ALT 12 06/22/2020   AST 20 06/22/2020   NA 137 06/22/2020   K 3.6 06/22/2020   CL 102 06/22/2020   CREATININE 0.93 06/22/2020   BUN 18 06/22/2020   CO2 26 06/22/2020   TSH 3.23 06/22/2020   HGBA1C 5.3 06/22/2020    VAS [place] ABI WITH/WO TBI  Result Date:  05/28/2019 LOWER EXTREMITY DOPPLER STUDY Indications: Bilateral leg pain . High Risk Factors: Hyperlipidemia. Other Factors: History of walking 5 miles. Redish toes bilaterally.  Comparison Study: No prior exam Performing Technologist: Elita Quick RVT  Examination Guidelines: A complete evaluation includes at minimum, Doppler waveform signals and systolic blood pressure reading at the level of bilateral brachial, anterior tibial, and posterior tibial arteries, when vessel segments are accessible. Bilateral testing is considered an integral part of a complete examination. Photoelectric Plethysmograph (PPG) waveforms and toe systolic pressure readings are included as required and additional duplex testing as needed. Limited examinations for reoccurring indications may be performed as noted.  ABI Findings: +---------+------------------+-----+--------+--------+ Right    Rt Pressure (mmHg)IndexWaveformComment  +---------+------------------+-----+--------+--------+ Brachial 158                                     +---------+------------------+-----+--------+--------+ PTA      175               1.11 biphasic         +---------+------------------+-----+--------+--------+ DP       165               1.04 biphasic         +---------+------------------+-----+--------+--------+ Great Toe124               0.78 Normal           +---------+------------------+-----+--------+--------+ +---------+------------------+-----+--------+-------+ Left     Lt Pressure (mmHg)IndexWaveformComment +---------+------------------+-----+--------+-------+ Brachial 153                                    +---------+------------------+-----+--------+-------+ PTA      176               1.11 biphasic        +---------+------------------+-----+--------+-------+ DP       155               0.98 biphasic        +---------+------------------+-----+--------+-------+ Great Toe110               0.70 Normal           +---------+------------------+-----+--------+-------+ +-------+-----------+-----------+------------+------------+ ABI/TBIToday's ABIToday's TBIPrevious ABIPrevious TBI +-------+-----------+-----------+------------+------------+ Right  1.11       0.78                                +-------+-----------+-----------+------------+------------+ Left   1.11       0.70                                +-------+-----------+-----------+------------+------------+  Summary: Right: Resting right ankle-brachial index is within normal range. No evidence of significant right lower extremity arterial disease. The right toe-brachial index is normal. Left: Resting left ankle-brachial index is within normal range. No evidence of significant left lower extremity arterial disease. The left toe-brachial index is normal.  *See table(s) above for measurements and observations.  Electronically signed by Lemar Livings MD on 05/28/2019 at 10:58:43 AM.    Final     Assessment & Plan:   Cynthia Simpson was seen today for hypertension.  Diagnoses and all orders for this visit:  Need for shingles vaccine -     Zoster Vaccine Adjuvanted [facility]) injection; Inject 0.5 mLs into the muscle once for 1 dose.  Tachycardia- Her HR is normal. Will continue the current dose of nebivolol.  Essential hypertension- Her BP is well controlled.   I have discontinued Gardiner Ramus T. Requejo's zinc gluconate and atorvastatin. I am also having her start on Shingrix. Additionally, I am having her maintain her Omega-3 Fatty Acids (OMEGA 3 PO), Cholecalciferol (VITAMIN D PO), and nebivolol. We will continue to administer cyanocobalamin.  Meds ordered this encounter  Medications  . Zoster Vaccine Adjuvanted [facility]) injection    Sig: Inject 0.5 mLs into the muscle once for 1 dose.    Dispense:  0.5 mL    Refill:  1     Follow-up: No follow-ups on file.  Sanda Linger, MD

## 2020-08-10 NOTE — Patient Instructions (Signed)

## 2020-09-06 ENCOUNTER — Ambulatory Visit (INDEPENDENT_AMBULATORY_CARE_PROVIDER_SITE_OTHER): Payer: Medicare Other

## 2020-09-06 ENCOUNTER — Other Ambulatory Visit: Payer: Self-pay

## 2020-09-06 DIAGNOSIS — E538 Deficiency of other specified B group vitamins: Secondary | ICD-10-CM

## 2020-09-06 DIAGNOSIS — G63 Polyneuropathy in diseases classified elsewhere: Secondary | ICD-10-CM | POA: Diagnosis not present

## 2020-09-06 NOTE — Progress Notes (Signed)
Pt here for monthly B12 injection per Dr Jones.  B12 1000mcg given IM right deltoid and pt tolerated injection well.  Next B12 injection scheduled for 10/06/20.  

## 2020-10-06 ENCOUNTER — Ambulatory Visit (INDEPENDENT_AMBULATORY_CARE_PROVIDER_SITE_OTHER): Payer: Medicare Other

## 2020-10-06 ENCOUNTER — Other Ambulatory Visit: Payer: Self-pay

## 2020-10-06 DIAGNOSIS — E538 Deficiency of other specified B group vitamins: Secondary | ICD-10-CM

## 2020-10-06 NOTE — Progress Notes (Signed)
B12 given Please cosign 

## 2020-11-06 ENCOUNTER — Ambulatory Visit: Payer: Medicare Other

## 2020-11-09 ENCOUNTER — Other Ambulatory Visit: Payer: Self-pay

## 2020-11-09 ENCOUNTER — Ambulatory Visit (INDEPENDENT_AMBULATORY_CARE_PROVIDER_SITE_OTHER): Payer: Medicare Other

## 2020-11-09 DIAGNOSIS — E538 Deficiency of other specified B group vitamins: Secondary | ICD-10-CM | POA: Diagnosis not present

## 2020-11-09 DIAGNOSIS — G63 Polyneuropathy in diseases classified elsewhere: Secondary | ICD-10-CM

## 2020-11-09 MED ORDER — CYANOCOBALAMIN 1000 MCG/ML IJ SOLN
1000.0000 ug | Freq: Once | INTRAMUSCULAR | Status: AC
  Administered 2020-11-09: 1000 ug via INTRAMUSCULAR

## 2020-11-09 NOTE — Progress Notes (Signed)
Pt given B12 injection w/o any complications. 

## 2020-12-13 ENCOUNTER — Other Ambulatory Visit: Payer: Self-pay

## 2020-12-13 ENCOUNTER — Ambulatory Visit (INDEPENDENT_AMBULATORY_CARE_PROVIDER_SITE_OTHER): Payer: Medicare Other

## 2020-12-13 DIAGNOSIS — G63 Polyneuropathy in diseases classified elsewhere: Secondary | ICD-10-CM

## 2020-12-13 DIAGNOSIS — E538 Deficiency of other specified B group vitamins: Secondary | ICD-10-CM

## 2020-12-13 MED ORDER — CYANOCOBALAMIN 1000 MCG/ML IJ SOLN
1000.0000 ug | Freq: Once | INTRAMUSCULAR | Status: AC
  Administered 2020-12-13: 1000 ug via INTRAMUSCULAR

## 2020-12-13 NOTE — Progress Notes (Signed)
B12 given w/o any complications. 

## 2020-12-14 ENCOUNTER — Ambulatory Visit: Payer: Medicare Other

## 2021-01-03 ENCOUNTER — Telehealth: Payer: Self-pay | Admitting: Internal Medicine

## 2021-01-03 ENCOUNTER — Other Ambulatory Visit: Payer: Self-pay | Admitting: Internal Medicine

## 2021-01-03 DIAGNOSIS — R000 Tachycardia, unspecified: Secondary | ICD-10-CM

## 2021-01-03 DIAGNOSIS — I10 Essential (primary) hypertension: Secondary | ICD-10-CM

## 2021-01-03 MED ORDER — NEBIVOLOL HCL 5 MG PO TABS: 5.0000 mg | ORAL_TABLET | Freq: Every day | ORAL | 0 refills | Status: DC

## 2021-01-03 NOTE — Telephone Encounter (Signed)
1.Medication Requested: nebivolol (BYSTOLIC) 5 MG tablet  2. Pharmacy (Name, Street, [place]): [facility] [phone] - [place], [place] - [address] AT [facility] OF [address]  3. On Med List: yes  4. Last Visit with PCP: 08-10-2020  5. Next visit date with PCP: 01-15-2021 nurse visit   Agent: Please be advised that RX refills may take up to 3 business days. We ask that you follow-up with your pharmacy.

## 2021-01-12 ENCOUNTER — Ambulatory Visit: Payer: Medicare Other

## 2021-01-15 ENCOUNTER — Ambulatory Visit (INDEPENDENT_AMBULATORY_CARE_PROVIDER_SITE_OTHER): Payer: Medicare Other

## 2021-01-15 ENCOUNTER — Other Ambulatory Visit: Payer: Self-pay

## 2021-01-15 DIAGNOSIS — E538 Deficiency of other specified B group vitamins: Secondary | ICD-10-CM | POA: Diagnosis not present

## 2021-01-15 DIAGNOSIS — G63 Polyneuropathy in diseases classified elsewhere: Secondary | ICD-10-CM | POA: Diagnosis not present

## 2021-01-15 MED ORDER — CYANOCOBALAMIN 1000 MCG/ML IJ SOLN
1000.0000 ug | Freq: Once | INTRAMUSCULAR | Status: AC
  Administered 2021-01-15: 1000 ug via INTRAMUSCULAR

## 2021-01-15 NOTE — Progress Notes (Addendum)
Pt here for monthly B12 injection per Dr. Yetta Barre  B12 given IM, and pt tolerated injection well.  Next B12 injection scheduled for 02/15/21  I have reviewed and agree

## 2021-02-14 ENCOUNTER — Ambulatory Visit (INDEPENDENT_AMBULATORY_CARE_PROVIDER_SITE_OTHER): Payer: Medicare Other

## 2021-02-14 ENCOUNTER — Other Ambulatory Visit: Payer: Self-pay

## 2021-02-14 DIAGNOSIS — E538 Deficiency of other specified B group vitamins: Secondary | ICD-10-CM | POA: Diagnosis not present

## 2021-02-14 DIAGNOSIS — G63 Polyneuropathy in diseases classified elsewhere: Secondary | ICD-10-CM | POA: Diagnosis not present

## 2021-02-14 MED ORDER — CYANOCOBALAMIN 1000 MCG/ML IJ SOLN
1000.0000 ug | Freq: Once | INTRAMUSCULAR | Status: AC
  Administered 2021-02-14: 1000 ug via INTRAMUSCULAR

## 2021-02-14 NOTE — Progress Notes (Signed)
Pt was given B12 w/o any complications. 

## 2021-03-19 ENCOUNTER — Ambulatory Visit (INDEPENDENT_AMBULATORY_CARE_PROVIDER_SITE_OTHER): Payer: Medicare Other

## 2021-03-19 ENCOUNTER — Other Ambulatory Visit: Payer: Self-pay

## 2021-03-19 DIAGNOSIS — G63 Polyneuropathy in diseases classified elsewhere: Secondary | ICD-10-CM

## 2021-03-19 DIAGNOSIS — E538 Deficiency of other specified B group vitamins: Secondary | ICD-10-CM

## 2021-03-19 MED ORDER — CYANOCOBALAMIN 1000 MCG/ML IJ SOLN
1000.0000 ug | Freq: Once | INTRAMUSCULAR | Status: AC
  Administered 2021-03-19: 1000 ug via INTRAMUSCULAR

## 2021-03-19 NOTE — Progress Notes (Signed)
Pt was given B12 w/o any complications. 

## 2021-03-30 ENCOUNTER — Other Ambulatory Visit: Payer: Self-pay | Admitting: Internal Medicine

## 2021-03-30 DIAGNOSIS — I10 Essential (primary) hypertension: Secondary | ICD-10-CM

## 2021-03-30 DIAGNOSIS — R000 Tachycardia, unspecified: Secondary | ICD-10-CM

## 2021-04-20 ENCOUNTER — Ambulatory Visit (INDEPENDENT_AMBULATORY_CARE_PROVIDER_SITE_OTHER): Payer: Medicare Other

## 2021-04-20 ENCOUNTER — Other Ambulatory Visit: Payer: Self-pay

## 2021-04-20 DIAGNOSIS — G63 Polyneuropathy in diseases classified elsewhere: Secondary | ICD-10-CM

## 2021-04-20 DIAGNOSIS — E538 Deficiency of other specified B group vitamins: Secondary | ICD-10-CM | POA: Diagnosis not present

## 2021-04-20 MED ORDER — CYANOCOBALAMIN 1000 MCG/ML IJ SOLN
1000.0000 ug | Freq: Once | INTRAMUSCULAR | Status: AC
  Administered 2021-04-20: 1000 ug via INTRAMUSCULAR

## 2021-04-20 NOTE — Progress Notes (Signed)
Pt here for monthly B12 injection   B12 given IM, and pt tolerated injection well.  Next B12 injection scheduled for 05/18/21

## 2021-05-18 ENCOUNTER — Ambulatory Visit (INDEPENDENT_AMBULATORY_CARE_PROVIDER_SITE_OTHER): Payer: Medicare Other

## 2021-05-18 ENCOUNTER — Other Ambulatory Visit: Payer: Self-pay

## 2021-05-18 DIAGNOSIS — G63 Polyneuropathy in diseases classified elsewhere: Secondary | ICD-10-CM | POA: Diagnosis not present

## 2021-05-18 DIAGNOSIS — E538 Deficiency of other specified B group vitamins: Secondary | ICD-10-CM | POA: Diagnosis not present

## 2021-05-18 MED ORDER — CYANOCOBALAMIN 1000 MCG/ML IJ SOLN
1000.0000 ug | Freq: Once | INTRAMUSCULAR | Status: AC
  Administered 2021-05-18: 1000 ug via INTRAMUSCULAR

## 2021-05-18 NOTE — Progress Notes (Signed)
Pt here for monthly B12 injection  ? ?B12 given IM, and pt tolerated injection well. ? ?Next B12 injection scheduled for 06/18/21 ? ?

## 2021-06-02 ENCOUNTER — Encounter: Payer: Self-pay | Admitting: Internal Medicine

## 2021-06-18 ENCOUNTER — Telehealth: Payer: Self-pay

## 2021-06-18 ENCOUNTER — Other Ambulatory Visit: Payer: Self-pay | Admitting: Internal Medicine

## 2021-06-18 ENCOUNTER — Ambulatory Visit (INDEPENDENT_AMBULATORY_CARE_PROVIDER_SITE_OTHER): Payer: Medicare Other

## 2021-06-18 DIAGNOSIS — G63 Polyneuropathy in diseases classified elsewhere: Secondary | ICD-10-CM | POA: Diagnosis not present

## 2021-06-18 DIAGNOSIS — I7381 Erythromelalgia: Secondary | ICD-10-CM

## 2021-06-18 DIAGNOSIS — I10 Essential (primary) hypertension: Secondary | ICD-10-CM

## 2021-06-18 DIAGNOSIS — E538 Deficiency of other specified B group vitamins: Secondary | ICD-10-CM | POA: Diagnosis not present

## 2021-06-18 DIAGNOSIS — R000 Tachycardia, unspecified: Secondary | ICD-10-CM

## 2021-06-18 MED ORDER — GABAPENTIN 100 MG PO CAPS: 100.0000 mg | ORAL_CAPSULE | Freq: Three times a day (TID) | ORAL | 0 refills | Status: DC

## 2021-06-18 MED ORDER — CYANOCOBALAMIN 1000 MCG/ML IJ SOLN
1000.0000 ug | Freq: Once | INTRAMUSCULAR | Status: AC
  Administered 2021-06-18: 1000 ug via INTRAMUSCULAR

## 2021-06-18 MED ORDER — NEBIVOLOL HCL 5 MG PO TABS: ORAL_TABLET | ORAL | 0 refills | Status: DC

## 2021-06-18 NOTE — Progress Notes (Signed)
Pt here for monthly B12 injection per Dr. Ronnald Ramp ? ?B12 1088mcg given IM, and pt tolerated injection well. ? ?Next B12 injection scheduled for 07/20/21 ?

## 2021-06-18 NOTE — Telephone Encounter (Signed)
Pt is requesting rx refills on:  ? ?Nebivolol and reports she has gone back to taking her Gabapentin again as her toes have been bothering her. She notes that since she has started taking it again she has noticed an improvement and they haven't been bothering her as much and would like to see if he can send in a refill of the Gabapentin as well. ?

## 2021-06-19 ENCOUNTER — Other Ambulatory Visit: Payer: Self-pay | Admitting: Internal Medicine

## 2021-06-19 DIAGNOSIS — I7381 Erythromelalgia: Secondary | ICD-10-CM

## 2021-07-20 ENCOUNTER — Ambulatory Visit (INDEPENDENT_AMBULATORY_CARE_PROVIDER_SITE_OTHER): Payer: Medicare Other

## 2021-07-20 DIAGNOSIS — E538 Deficiency of other specified B group vitamins: Secondary | ICD-10-CM

## 2021-07-20 DIAGNOSIS — G63 Polyneuropathy in diseases classified elsewhere: Secondary | ICD-10-CM

## 2021-07-20 MED ORDER — CYANOCOBALAMIN 1000 MCG/ML IJ SOLN
1000.0000 ug | Freq: Once | INTRAMUSCULAR | Status: AC
  Administered 2021-07-20: 1000 ug via INTRAMUSCULAR

## 2021-07-20 NOTE — Progress Notes (Signed)
Pt was given B12 injection w/o any complications. 

## 2021-08-08 DIAGNOSIS — H524 Presbyopia: Secondary | ICD-10-CM | POA: Diagnosis not present

## 2021-08-24 ENCOUNTER — Ambulatory Visit (INDEPENDENT_AMBULATORY_CARE_PROVIDER_SITE_OTHER): Payer: Medicare Other

## 2021-08-24 DIAGNOSIS — E538 Deficiency of other specified B group vitamins: Secondary | ICD-10-CM

## 2021-08-24 MED ORDER — CYANOCOBALAMIN 1000 MCG/ML IJ SOLN
1000.0000 ug | Freq: Once | INTRAMUSCULAR | Status: AC
  Administered 2021-08-24: 1000 ug via INTRAMUSCULAR

## 2021-08-24 NOTE — Progress Notes (Signed)
After obtaining consent, and per orders of Dr. Jones, injection of B12 given by Jonan Seufert. Patient tolerated injection well in right deltoid and instructed to report any adverse reaction to me immediately.  

## 2021-09-26 ENCOUNTER — Ambulatory Visit: Payer: Medicare Other

## 2021-09-26 ENCOUNTER — Other Ambulatory Visit: Payer: Self-pay | Admitting: Internal Medicine

## 2021-09-26 DIAGNOSIS — R000 Tachycardia, unspecified: Secondary | ICD-10-CM

## 2021-09-26 DIAGNOSIS — I10 Essential (primary) hypertension: Secondary | ICD-10-CM

## 2021-09-28 ENCOUNTER — Other Ambulatory Visit: Payer: Self-pay | Admitting: Internal Medicine

## 2021-09-28 ENCOUNTER — Ambulatory Visit (INDEPENDENT_AMBULATORY_CARE_PROVIDER_SITE_OTHER): Payer: Medicare Other

## 2021-09-28 DIAGNOSIS — E538 Deficiency of other specified B group vitamins: Secondary | ICD-10-CM | POA: Diagnosis not present

## 2021-09-28 DIAGNOSIS — R000 Tachycardia, unspecified: Secondary | ICD-10-CM

## 2021-09-28 DIAGNOSIS — I10 Essential (primary) hypertension: Secondary | ICD-10-CM

## 2021-09-28 MED ORDER — NEBIVOLOL HCL 5 MG PO TABS: ORAL_TABLET | ORAL | 0 refills | Status: DC

## 2021-09-28 MED ORDER — CYANOCOBALAMIN 1000 MCG/ML IJ SOLN
1000.0000 ug | INTRAMUSCULAR | Status: DC
  Administered 2021-09-28 – 2022-03-15 (×2): 1000 ug via INTRAMUSCULAR

## 2021-09-28 NOTE — Progress Notes (Unsigned)
B12 given Please cosign 

## 2021-10-01 ENCOUNTER — Encounter: Payer: Self-pay | Admitting: Internal Medicine

## 2021-10-01 ENCOUNTER — Ambulatory Visit (INDEPENDENT_AMBULATORY_CARE_PROVIDER_SITE_OTHER): Payer: Medicare Other | Admitting: Internal Medicine

## 2021-10-01 VITALS — BP 140/86 | HR 100 | Temp 98.0°F | Ht 66.0 in | Wt 133.0 lb

## 2021-10-01 DIAGNOSIS — I10 Essential (primary) hypertension: Secondary | ICD-10-CM | POA: Diagnosis not present

## 2021-10-01 DIAGNOSIS — I7381 Erythromelalgia: Secondary | ICD-10-CM

## 2021-10-01 DIAGNOSIS — N1831 Chronic kidney disease, stage 3a: Secondary | ICD-10-CM

## 2021-10-01 DIAGNOSIS — Z0000 Encounter for general adult medical examination without abnormal findings: Secondary | ICD-10-CM

## 2021-10-01 DIAGNOSIS — E785 Hyperlipidemia, unspecified: Secondary | ICD-10-CM | POA: Diagnosis not present

## 2021-10-01 DIAGNOSIS — G63 Polyneuropathy in diseases classified elsewhere: Secondary | ICD-10-CM | POA: Diagnosis not present

## 2021-10-01 DIAGNOSIS — R000 Tachycardia, unspecified: Secondary | ICD-10-CM | POA: Diagnosis not present

## 2021-10-01 DIAGNOSIS — E538 Deficiency of other specified B group vitamins: Secondary | ICD-10-CM | POA: Diagnosis not present

## 2021-10-01 DIAGNOSIS — Z0001 Encounter for general adult medical examination with abnormal findings: Secondary | ICD-10-CM

## 2021-10-01 LAB — LIPID PANEL
Cholesterol: 217 mg/dL — ABNORMAL HIGH (ref 0–200)
HDL: 70.4 mg/dL
LDL Cholesterol: 125 mg/dL — ABNORMAL HIGH (ref 0–99)
NonHDL: 146.61
Total CHOL/HDL Ratio: 3
Triglycerides: 107 mg/dL (ref 0.0–149.0)
VLDL: 21.4 mg/dL (ref 0.0–40.0)

## 2021-10-01 LAB — HEPATIC FUNCTION PANEL
ALT: 14 U/L (ref 0–35)
AST: 21 U/L (ref 0–37)
Albumin: 4.1 g/dL (ref 3.5–5.2)
Alkaline Phosphatase: 42 U/L (ref 39–117)
Bilirubin, Direct: 0.1 mg/dL (ref 0.0–0.3)
Total Bilirubin: 0.7 mg/dL (ref 0.2–1.2)
Total Protein: 6.9 g/dL (ref 6.0–8.3)

## 2021-10-01 LAB — CBC WITH DIFFERENTIAL/PLATELET
Basophils Absolute: 0 10*3/uL (ref 0.0–0.1)
Basophils Relative: 0.7 % (ref 0.0–3.0)
Eosinophils Absolute: 0.1 10*3/uL (ref 0.0–0.7)
Eosinophils Relative: 1.7 % (ref 0.0–5.0)
HCT: 39.7 % (ref 36.0–46.0)
Hemoglobin: 13.5 g/dL (ref 12.0–15.0)
Lymphocytes Relative: 31.2 % (ref 12.0–46.0)
Lymphs Abs: 1.9 10*3/uL (ref 0.7–4.0)
MCHC: 33.9 g/dL (ref 30.0–36.0)
MCV: 96 fl (ref 78.0–100.0)
Monocytes Absolute: 0.5 10*3/uL (ref 0.1–1.0)
Monocytes Relative: 8.2 % (ref 3.0–12.0)
Neutro Abs: 3.5 10*3/uL (ref 1.4–7.7)
Neutrophils Relative %: 58.2 % (ref 43.0–77.0)
Platelets: 257 10*3/uL (ref 150.0–400.0)
RBC: 4.14 Mil/uL (ref 3.87–5.11)
RDW: 13.6 % (ref 11.5–15.5)
WBC: 6.1 10*3/uL (ref 4.0–10.5)

## 2021-10-01 LAB — URINALYSIS, ROUTINE W REFLEX MICROSCOPIC
Bilirubin Urine: NEGATIVE
Hgb urine dipstick: NEGATIVE
Leukocytes,Ua: NEGATIVE
Nitrite: NEGATIVE
RBC / HPF: NONE SEEN (ref 0–?)
Specific Gravity, Urine: 1.025 (ref 1.000–1.030)
Total Protein, Urine: NEGATIVE
Urine Glucose: NEGATIVE
Urobilinogen, UA: 0.2 (ref 0.0–1.0)
pH: 5.5 (ref 5.0–8.0)

## 2021-10-01 LAB — FOLATE: Folate: 13.4 ng/mL

## 2021-10-01 LAB — BASIC METABOLIC PANEL
BUN: 21 mg/dL (ref 6–23)
CO2: 23 mEq/L (ref 19–32)
Calcium: 9.5 mg/dL (ref 8.4–10.5)
Chloride: 102 mEq/L (ref 96–112)
Creatinine, Ser: 0.82 mg/dL (ref 0.40–1.20)
GFR: 68.77 mL/min (ref >60.00)
Glucose, Bld: 101 mg/dL — ABNORMAL HIGH (ref 70–99)
Potassium: 4.5 mEq/L (ref 3.5–5.1)
Sodium: 135 mEq/L (ref 135–145)

## 2021-10-01 LAB — TSH: TSH: 3 u[IU]/mL (ref 0.35–5.50)

## 2021-10-01 MED ORDER — ROSUVASTATIN CALCIUM 10 MG PO TABS: 10.0000 mg | ORAL_TABLET | Freq: Every day | ORAL | 1 refills | Status: DC

## 2021-10-01 MED ORDER — GABAPENTIN 100 MG PO CAPS: ORAL_CAPSULE | ORAL | 1 refills | Status: DC

## 2021-10-01 NOTE — Patient Instructions (Signed)

## 2021-10-01 NOTE — Progress Notes (Unsigned)
Subjective:  Patient ID: Cynthia Simpson, female    DOB: November 08, 1945  Age: 76 y.o. MRN: [id number]  CC: No chief complaint on file.   HPI ZERIYAH WAIN presents for ***  Outpatient Medications Prior to Visit  Medication Sig Dispense Refill   Cholecalciferol (VITAMIN D PO) Take 2 capsules by mouth daily.     nebivolol (BYSTOLIC) 5 MG tablet TAKE 1 TABLET(5 MG) BY MOUTH DAILY 90 tablet 0   Omega-3 Fatty Acids (OMEGA 3 PO) Take 2 capsules by mouth daily.     gabapentin (NEURONTIN) 100 MG capsule TAKE 1 CAPSULE(100 MG) BY MOUTH THREE TIMES DAILY 270 capsule 1   Facility-Administered Medications Prior to Visit  Medication Dose Route Frequency Provider Last Rate Last Admin   cyanocobalamin ((VITAMIN B-12)) injection 1,000 mcg  1,000 mcg Intramuscular Q30 days Etta Grandchild, MD   1,000 mcg at 09/28/21 0907    ROS Review of Systems  Objective:  BP 140/86 (BP Location: Left Arm, Patient Position: Sitting, Cuff Size: Large)   Pulse 100   Temp 98 F (36.7 C) (Oral)   Ht 5\' 6"  (1.676 m)   Wt 133 lb (60.3 kg)   SpO2 98%   BMI 21.47 kg/m   BP Readings from Last 3 Encounters:  10/01/21 140/86  08/10/20 (!) 144/86  06/22/20 (!) 160/88    Wt Readings from Last 3 Encounters:  10/01/21 133 lb (60.3 kg)  08/10/20 128 lb (58.1 kg)  06/22/20 127 lb 3.2 oz (57.7 kg)    Physical Exam Vitals reviewed.  HENT:     Nose: Nose normal.     Mouth/Throat:     Mouth: Mucous membranes are moist.  Eyes:     General: No scleral icterus.    Conjunctiva/sclera: Conjunctivae normal.  Cardiovascular:     Rate and Rhythm: Normal rate and regular rhythm.     Heart sounds: Normal heart sounds, S1 normal and S2 normal.     No friction rub. No gallop.     Comments: EKG-  NSR, 75 bpm TWI in V1 V3 V4 No LVH or Q waves Pulmonary:     Effort: Pulmonary effort is normal.     Breath sounds: No stridor. No wheezing, rhonchi or rales.  Abdominal:     General: Abdomen is flat.      Palpations: There is no mass.     Tenderness: There is no abdominal tenderness. There is no guarding or rebound.     Hernia: No hernia is present.  Musculoskeletal:     Cervical back: Neck supple.     Right lower leg: No edema.     Left lower leg: No edema.  Lymphadenopathy:     Cervical: No cervical adenopathy.     Lab Results  Component Value Date   WBC 6.1 10/01/2021   HGB 13.5 10/01/2021   HCT 39.7 10/01/2021   PLT 257.0 10/01/2021   GLUCOSE 101 (H) 10/01/2021   CHOL 217 (H) 10/01/2021   TRIG 107.0 10/01/2021   HDL 70.40 10/01/2021   LDLCALC 125 (H) 10/01/2021   ALT 14 10/01/2021   AST 21 10/01/2021   NA 135 10/01/2021   K 4.5 10/01/2021   CL 102 10/01/2021   CREATININE 0.82 10/01/2021   BUN 21 10/01/2021   CO2 23 10/01/2021   TSH 3.00 10/01/2021   HGBA1C 5.3 06/22/2020    VAS 06/24/2020 ABI WITH/WO TBI  Result Date: 05/28/2019 LOWER EXTREMITY DOPPLER STUDY Indications: Bilateral leg pain . High Risk  Factors: Hyperlipidemia. Other Factors: History of walking 5 miles. Redish toes bilaterally.  Comparison Study: No prior exam Performing Technologist: Elita Quick RVT  Examination Guidelines: A complete evaluation includes at minimum, Doppler waveform signals and systolic blood pressure reading at the level of bilateral brachial, anterior tibial, and posterior tibial arteries, when vessel segments are accessible. Bilateral testing is considered an integral part of a complete examination. Photoelectric Plethysmograph (PPG) waveforms and toe systolic pressure readings are included as required and additional duplex testing as needed. Limited examinations for reoccurring indications may be performed as noted.  ABI Findings: +---------+------------------+-----+--------+--------+ Right    Rt Pressure (mmHg)IndexWaveformComment  +---------+------------------+-----+--------+--------+ Brachial 158                                      +---------+------------------+-----+--------+--------+ PTA      175               1.11 biphasic         +---------+------------------+-----+--------+--------+ DP       165               1.04 biphasic         +---------+------------------+-----+--------+--------+ Great Toe124               0.78 Normal           +---------+------------------+-----+--------+--------+ +---------+------------------+-----+--------+-------+ Left     Lt Pressure (mmHg)IndexWaveformComment +---------+------------------+-----+--------+-------+ Brachial 153                                    +---------+------------------+-----+--------+-------+ PTA      176               1.11 biphasic        +---------+------------------+-----+--------+-------+ DP       155               0.98 biphasic        +---------+------------------+-----+--------+-------+ Great Toe110               0.70 Normal          +---------+------------------+-----+--------+-------+ +-------+-----------+-----------+------------+------------+ ABI/TBIToday's ABIToday's TBIPrevious ABIPrevious TBI +-------+-----------+-----------+------------+------------+ Right  1.11       0.78                                +-------+-----------+-----------+------------+------------+ Left   1.11       0.70                                +-------+-----------+-----------+------------+------------+  Summary: Right: Resting right ankle-brachial index is within normal range. No evidence of significant right lower extremity arterial disease. The right toe-brachial index is normal. Left: Resting left ankle-brachial index is within normal range. No evidence of significant left lower extremity arterial disease. The left toe-brachial index is normal.  *See table(s) above for measurements and observations.  Electronically signed by Lemar Livings MD on 05/28/2019 at 10:58:43 AM.    Final     Assessment & Plan:   Diagnoses and all orders for  this visit:  Essential hypertension -     Basic metabolic panel; Future -     TSH; Future -     Urinalysis, Routine w reflex microscopic; Future -  Hepatic function panel; Future -     EKG 12-Lead -     Hepatic function panel -     Urinalysis, Routine w reflex microscopic -     TSH -     Basic metabolic panel  Vitamin B12 deficiency neuropathy (HCC) -     CBC with Differential/Platelet; Future -     Folate; Future -     Folate -     CBC with Differential/Platelet  Stage 3a chronic kidney disease (HCC) -     Urinalysis, Routine w reflex microscopic; Future -     Urinalysis, Routine w reflex microscopic  Hyperlipidemia LDL goal <130 -     TSH; Future -     Hepatic function panel; Future -     Lipid panel; Future -     Lipid panel -     Hepatic function panel -     TSH  Encounter for general adult medical examination with abnormal findings  Tachycardia -     EKG 12-Lead  Erythromelalgia, traumatic (HCC) -     gabapentin (NEURONTIN) 100 MG capsule; TAKE 1 CAPSULE(100 MG) BY MOUTH THREE TIMES DAILY   I am having Gardiner Ramus T. Cart maintain her Omega-3 Fatty Acids (OMEGA 3 PO), Cholecalciferol (VITAMIN D PO), nebivolol, and gabapentin. We will continue to administer cyanocobalamin.  Meds ordered this encounter  Medications   gabapentin (NEURONTIN) 100 MG capsule    Sig: TAKE 1 CAPSULE(100 MG) BY MOUTH THREE TIMES DAILY    Dispense:  270 capsule    Refill:  1    ZERO refills remain on this prescription. Your patient is requesting advance approval of refills for this medication to PREVENT ANY MISSED DOSES     Follow-up: Return in about 6 months (around 04/03/2022).  Sanda Linger, MD

## 2021-11-02 ENCOUNTER — Ambulatory Visit (INDEPENDENT_AMBULATORY_CARE_PROVIDER_SITE_OTHER): Payer: Medicare Other | Admitting: *Deleted

## 2021-11-02 DIAGNOSIS — E538 Deficiency of other specified B group vitamins: Secondary | ICD-10-CM

## 2021-11-02 MED ORDER — CYANOCOBALAMIN 1000 MCG/ML IJ SOLN
1000.0000 ug | Freq: Once | INTRAMUSCULAR | Status: AC
  Administered 2021-11-02: 1000 ug via INTRAMUSCULAR

## 2021-11-02 NOTE — Progress Notes (Signed)
Patient here in office to get her b12 injection.Given in right deltoid. Patient tolerated well  Please cosign

## 2021-12-03 ENCOUNTER — Ambulatory Visit (INDEPENDENT_AMBULATORY_CARE_PROVIDER_SITE_OTHER): Payer: Medicare Other

## 2021-12-03 DIAGNOSIS — E538 Deficiency of other specified B group vitamins: Secondary | ICD-10-CM | POA: Diagnosis not present

## 2021-12-03 MED ORDER — CYANOCOBALAMIN 1000 MCG/ML IJ SOLN
1000.0000 ug | Freq: Once | INTRAMUSCULAR | Status: AC
  Administered 2021-12-03: 1000 ug via INTRAMUSCULAR

## 2021-12-03 NOTE — Progress Notes (Signed)
After obtaining consent, and per orders of Dr. Jones, injection of B12 given in the left deltoid by Iseah Plouff P Aurora Rody. Patient instructed to report any adverse reaction to me immediately.  

## 2021-12-24 ENCOUNTER — Other Ambulatory Visit: Payer: Self-pay | Admitting: Internal Medicine

## 2021-12-24 DIAGNOSIS — R000 Tachycardia, unspecified: Secondary | ICD-10-CM

## 2021-12-24 DIAGNOSIS — I10 Essential (primary) hypertension: Secondary | ICD-10-CM

## 2022-01-07 ENCOUNTER — Ambulatory Visit (INDEPENDENT_AMBULATORY_CARE_PROVIDER_SITE_OTHER): Payer: Medicare Other

## 2022-01-07 DIAGNOSIS — E538 Deficiency of other specified B group vitamins: Secondary | ICD-10-CM | POA: Diagnosis not present

## 2022-01-07 DIAGNOSIS — Z23 Encounter for immunization: Secondary | ICD-10-CM

## 2022-01-07 MED ORDER — CYANOCOBALAMIN 1000 MCG/ML IJ SOLN
1000.0000 ug | Freq: Once | INTRAMUSCULAR | Status: AC
  Administered 2022-01-07: 1000 ug via INTRAMUSCULAR

## 2022-01-07 NOTE — Progress Notes (Signed)
After obtaining consent, and per orders of Dr. Ronnald Ramp, injection of B12 given in the left deltoid by Marrian Salvage. Patient instructed to to report any adverse reaction to me immediately.

## 2022-02-11 ENCOUNTER — Ambulatory Visit (INDEPENDENT_AMBULATORY_CARE_PROVIDER_SITE_OTHER): Payer: Medicare Other

## 2022-02-11 DIAGNOSIS — E538 Deficiency of other specified B group vitamins: Secondary | ICD-10-CM

## 2022-02-11 MED ORDER — CYANOCOBALAMIN 1000 MCG/ML IJ SOLN
1000.0000 ug | Freq: Once | INTRAMUSCULAR | Status: AC
  Administered 2022-02-11: 1000 ug via INTRAMUSCULAR

## 2022-02-11 NOTE — Progress Notes (Signed)
After obtaining consent, and per orders of Dr. Jones, injection of B12 given in the left deltoid by Colt Martelle P Alfonzo Arca. Patient instructed to report any adverse reaction to me immediately.  

## 2022-03-15 ENCOUNTER — Ambulatory Visit (INDEPENDENT_AMBULATORY_CARE_PROVIDER_SITE_OTHER): Payer: Medicare Other | Admitting: *Deleted

## 2022-03-15 DIAGNOSIS — E538 Deficiency of other specified B group vitamins: Secondary | ICD-10-CM | POA: Diagnosis not present

## 2022-03-15 NOTE — Progress Notes (Signed)
Patient is here for her B12 injection. Given in left deltoid. Patient tolerated well 

## 2022-03-21 ENCOUNTER — Other Ambulatory Visit: Payer: Self-pay | Admitting: Internal Medicine

## 2022-03-21 DIAGNOSIS — I10 Essential (primary) hypertension: Secondary | ICD-10-CM

## 2022-03-21 DIAGNOSIS — R000 Tachycardia, unspecified: Secondary | ICD-10-CM

## 2022-04-01 ENCOUNTER — Other Ambulatory Visit: Payer: Self-pay | Admitting: Internal Medicine

## 2022-04-01 DIAGNOSIS — I7381 Erythromelalgia: Secondary | ICD-10-CM

## 2022-04-17 ENCOUNTER — Ambulatory Visit (INDEPENDENT_AMBULATORY_CARE_PROVIDER_SITE_OTHER): Payer: Medicare Other | Admitting: *Deleted

## 2022-04-17 DIAGNOSIS — E538 Deficiency of other specified B group vitamins: Secondary | ICD-10-CM

## 2022-04-17 MED ORDER — CYANOCOBALAMIN 1000 MCG/ML IJ SOLN
1000.0000 ug | Freq: Once | INTRAMUSCULAR | Status: AC
  Administered 2022-04-17: 1000 ug via INTRAMUSCULAR

## 2022-04-17 NOTE — Progress Notes (Signed)
Pls cosign for B12 inj../lmb  

## 2022-05-17 ENCOUNTER — Ambulatory Visit (INDEPENDENT_AMBULATORY_CARE_PROVIDER_SITE_OTHER): Payer: Medicare Other

## 2022-05-17 ENCOUNTER — Other Ambulatory Visit: Payer: Self-pay | Admitting: Internal Medicine

## 2022-05-17 ENCOUNTER — Telehealth: Payer: Self-pay

## 2022-05-17 DIAGNOSIS — E538 Deficiency of other specified B group vitamins: Secondary | ICD-10-CM

## 2022-05-17 DIAGNOSIS — G63 Polyneuropathy in diseases classified elsewhere: Secondary | ICD-10-CM | POA: Diagnosis not present

## 2022-05-17 DIAGNOSIS — I7381 Erythromelalgia: Secondary | ICD-10-CM

## 2022-05-17 MED ORDER — CYANOCOBALAMIN 1000 MCG/ML IJ SOLN
1000.0000 ug | Freq: Once | INTRAMUSCULAR | Status: AC
  Administered 2022-05-17: 1000 ug via INTRAMUSCULAR

## 2022-05-17 MED ORDER — GABAPENTIN 100 MG PO CAPS: 200.0000 mg | ORAL_CAPSULE | Freq: Three times a day (TID) | ORAL | 0 refills | Status: DC

## 2022-05-17 NOTE — Progress Notes (Signed)
Pt was given B12 injection with no complications.

## 2022-06-17 ENCOUNTER — Other Ambulatory Visit: Payer: Self-pay | Admitting: Internal Medicine

## 2022-06-17 DIAGNOSIS — R000 Tachycardia, unspecified: Secondary | ICD-10-CM

## 2022-06-17 DIAGNOSIS — I10 Essential (primary) hypertension: Secondary | ICD-10-CM

## 2022-06-19 ENCOUNTER — Other Ambulatory Visit: Payer: Self-pay | Admitting: Internal Medicine

## 2022-06-19 ENCOUNTER — Ambulatory Visit (INDEPENDENT_AMBULATORY_CARE_PROVIDER_SITE_OTHER): Payer: Medicare Other

## 2022-06-19 ENCOUNTER — Telehealth: Payer: Self-pay

## 2022-06-19 DIAGNOSIS — I10 Essential (primary) hypertension: Secondary | ICD-10-CM

## 2022-06-19 DIAGNOSIS — E538 Deficiency of other specified B group vitamins: Secondary | ICD-10-CM | POA: Diagnosis not present

## 2022-06-19 DIAGNOSIS — R000 Tachycardia, unspecified: Secondary | ICD-10-CM

## 2022-06-19 MED ORDER — CYANOCOBALAMIN 1000 MCG/ML IJ SOLN
1000.0000 ug | Freq: Once | INTRAMUSCULAR | Status: AC
  Administered 2022-06-19: 1000 ug via INTRAMUSCULAR

## 2022-06-19 MED ORDER — NEBIVOLOL HCL 5 MG PO TABS: 5.0000 mg | ORAL_TABLET | Freq: Every day | ORAL | 0 refills | Status: DC

## 2022-06-19 NOTE — Telephone Encounter (Signed)
Pt came in today for nurse visit, pt stated that she been needing a refill of nebivolol (BYSTOLIC) 5 MG, was told she can not get a refill till she till she sees her provider. Pt stated that she has an upcoming appointment in may.

## 2022-06-19 NOTE — Progress Notes (Signed)
After obtaining consent, and per orders of Dr. Jones, injection of B12 given by Damonta Cossey P Lyndell Gillyard. Patient instructed to report any adverse reaction to me immediately.  

## 2022-07-22 ENCOUNTER — Ambulatory Visit (INDEPENDENT_AMBULATORY_CARE_PROVIDER_SITE_OTHER): Payer: Medicare Other

## 2022-07-22 DIAGNOSIS — E538 Deficiency of other specified B group vitamins: Secondary | ICD-10-CM | POA: Diagnosis not present

## 2022-07-22 MED ORDER — CYANOCOBALAMIN 1000 MCG/ML IJ SOLN
1000.0000 ug | Freq: Once | INTRAMUSCULAR | Status: AC
  Administered 2022-07-22: 1000 ug via INTRAMUSCULAR

## 2022-07-22 NOTE — Progress Notes (Signed)
After obtaining consent, and per orders of Dr. Jones, injection of B12 given by Aahana Elza P Simonne Boulos. Patient instructed to report any adverse reaction to me immediately.  

## 2022-08-23 ENCOUNTER — Ambulatory Visit (INDEPENDENT_AMBULATORY_CARE_PROVIDER_SITE_OTHER): Payer: Medicare Other

## 2022-08-23 DIAGNOSIS — E538 Deficiency of other specified B group vitamins: Secondary | ICD-10-CM

## 2022-08-23 MED ORDER — CYANOCOBALAMIN 1000 MCG/ML IJ SOLN
1000.0000 ug | Freq: Once | INTRAMUSCULAR | Status: AC
  Administered 2022-08-23: 1000 ug via INTRAMUSCULAR

## 2022-08-23 NOTE — Progress Notes (Signed)
B12 given.  Pt tolerated well. Pt is aware to give the office a call for an side effects or reactions. Please co-sign.   

## 2022-09-09 DIAGNOSIS — H5213 Myopia, bilateral: Secondary | ICD-10-CM | POA: Diagnosis not present

## 2022-09-13 ENCOUNTER — Other Ambulatory Visit: Payer: Self-pay | Admitting: Internal Medicine

## 2022-09-13 DIAGNOSIS — R000 Tachycardia, unspecified: Secondary | ICD-10-CM

## 2022-09-13 DIAGNOSIS — I10 Essential (primary) hypertension: Secondary | ICD-10-CM

## 2022-09-17 ENCOUNTER — Telehealth: Payer: Self-pay | Admitting: Internal Medicine

## 2022-09-17 NOTE — Telephone Encounter (Signed)
It has been over a year She will have to be seen this week

## 2022-09-17 NOTE — Telephone Encounter (Signed)
Patient has been scheduled for soonest availability of 10/10/2022. She said she has approximately 10 days left of medication.    Prescription Request  09/17/2022  LOV: 10/01/2021  What is the name of the medication or equipment?  nebivolol (BYSTOLIC) 5 MG tablet  Have you contacted your pharmacy to request a refill? Yes   Which pharmacy would you like this sent to?  [id number] - [place], [place] - [address] AT [facility] OF [address] [place] [address] Phone: [phone] Fax: [phone]    Patient notified that their request is being sent to the clinical staff for review and that they should receive a response within 2 business days.   Please advise at Mobile [phone] (mobile)

## 2022-09-20 NOTE — Telephone Encounter (Signed)
LVM informing pt she would need an OV prior to refills being sent as PCP stated it has been over a year since her last visit.

## 2022-09-20 NOTE — Telephone Encounter (Signed)
Please call patient concerning her medication - she called back today.

## 2022-09-25 ENCOUNTER — Ambulatory Visit (INDEPENDENT_AMBULATORY_CARE_PROVIDER_SITE_OTHER): Payer: Medicare Other | Admitting: Internal Medicine

## 2022-09-25 ENCOUNTER — Ambulatory Visit: Payer: Medicare Other

## 2022-09-25 ENCOUNTER — Encounter: Payer: Self-pay | Admitting: Internal Medicine

## 2022-09-25 VITALS — BP 148/84 | HR 97 | Temp 98.4°F | Resp 16 | Ht 66.0 in | Wt 135.0 lb

## 2022-09-25 DIAGNOSIS — I10 Essential (primary) hypertension: Secondary | ICD-10-CM | POA: Diagnosis not present

## 2022-09-25 DIAGNOSIS — E538 Deficiency of other specified B group vitamins: Secondary | ICD-10-CM

## 2022-09-25 DIAGNOSIS — N1831 Chronic kidney disease, stage 3a: Secondary | ICD-10-CM

## 2022-09-25 DIAGNOSIS — G63 Polyneuropathy in diseases classified elsewhere: Secondary | ICD-10-CM | POA: Diagnosis not present

## 2022-09-25 DIAGNOSIS — E785 Hyperlipidemia, unspecified: Secondary | ICD-10-CM | POA: Diagnosis not present

## 2022-09-25 DIAGNOSIS — R000 Tachycardia, unspecified: Secondary | ICD-10-CM

## 2022-09-25 DIAGNOSIS — I7381 Erythromelalgia: Secondary | ICD-10-CM

## 2022-09-25 LAB — CBC WITH DIFFERENTIAL/PLATELET
Basophils Absolute: 0 10*3/uL (ref 0.0–0.1)
Basophils Relative: 0.6 % (ref 0.0–3.0)
Eosinophils Absolute: 0.1 10*3/uL (ref 0.0–0.7)
Eosinophils Relative: 1.9 % (ref 0.0–5.0)
HCT: 41.6 % (ref 36.0–46.0)
Hemoglobin: 14 g/dL (ref 12.0–15.0)
Lymphocytes Relative: 23.3 % (ref 12.0–46.0)
Lymphs Abs: 1.6 10*3/uL (ref 0.7–4.0)
MCHC: 33.8 g/dL (ref 30.0–36.0)
MCV: 95.9 fl (ref 78.0–100.0)
Monocytes Absolute: 0.7 10*3/uL (ref 0.1–1.0)
Monocytes Relative: 10.2 % (ref 3.0–12.0)
Neutro Abs: 4.3 10*3/uL (ref 1.4–7.7)
Neutrophils Relative %: 64 % (ref 43.0–77.0)
Platelets: 294 10*3/uL (ref 150.0–400.0)
RBC: 4.34 Mil/uL (ref 3.87–5.11)
RDW: 13.7 % (ref 11.5–15.5)
WBC: 6.7 10*3/uL (ref 4.0–10.5)

## 2022-09-25 LAB — URINALYSIS, ROUTINE W REFLEX MICROSCOPIC
Bilirubin Urine: NEGATIVE
Hgb urine dipstick: NEGATIVE
Leukocytes,Ua: NEGATIVE
Nitrite: NEGATIVE
RBC / HPF: NONE SEEN (ref 0–?)
Specific Gravity, Urine: >=1.03 — AB (ref 1.000–1.030)
Urine Glucose: NEGATIVE
Urobilinogen, UA: 0.2 (ref 0.0–1.0)
pH: 5.5 (ref 5.0–8.0)

## 2022-09-25 LAB — LIPID PANEL
Cholesterol: 242 mg/dL — ABNORMAL HIGH (ref 0–200)
HDL: 67.3 mg/dL (ref >39.00)
LDL Cholesterol: 155 mg/dL — ABNORMAL HIGH (ref 0–99)
NonHDL: 174.57
Total CHOL/HDL Ratio: 4
Triglycerides: 97 mg/dL (ref 0.0–149.0)
VLDL: 19.4 mg/dL (ref 0.0–40.0)

## 2022-09-25 LAB — BASIC METABOLIC PANEL
BUN: 16 mg/dL (ref 6–23)
CO2: 26 mEq/L (ref 19–32)
Calcium: 10.4 mg/dL (ref 8.4–10.5)
Chloride: 104 mEq/L (ref 96–112)
Creatinine, Ser: 0.92 mg/dL (ref 0.40–1.20)
GFR: 59.49 mL/min — ABNORMAL LOW (ref >60.00)
Glucose, Bld: 111 mg/dL — ABNORMAL HIGH (ref 70–99)
Potassium: 4.5 mEq/L (ref 3.5–5.1)
Sodium: 140 mEq/L (ref 135–145)

## 2022-09-25 LAB — HEPATIC FUNCTION PANEL
ALT: 13 U/L (ref 0–35)
AST: 19 U/L (ref 0–37)
Albumin: 4.3 g/dL (ref 3.5–5.2)
Alkaline Phosphatase: 54 U/L (ref 39–117)
Bilirubin, Direct: 0.1 mg/dL (ref 0.0–0.3)
Total Bilirubin: 0.6 mg/dL (ref 0.2–1.2)
Total Protein: 7.6 g/dL (ref 6.0–8.3)

## 2022-09-25 LAB — TSH: TSH: 2.46 u[IU]/mL (ref 0.35–5.50)

## 2022-09-25 LAB — FOLATE: Folate: 11.8 ng/mL (ref >5.9)

## 2022-09-25 MED ORDER — NEBIVOLOL HCL 5 MG PO TABS
5.0000 mg | ORAL_TABLET | Freq: Every day | ORAL | 1 refills | Status: DC
Start: 2022-09-25 — End: 2023-03-21

## 2022-09-25 MED ORDER — GABAPENTIN 100 MG PO CAPS
200.0000 mg | ORAL_CAPSULE | Freq: Three times a day (TID) | ORAL | 1 refills | Status: DC
Start: 2022-09-25 — End: 2023-03-27

## 2022-09-25 MED ORDER — CYANOCOBALAMIN 1000 MCG/ML IJ SOLN
1000.0000 ug | Freq: Once | INTRAMUSCULAR | Status: AC
Start: 2022-09-25 — End: 2022-09-25
  Administered 2022-09-25: 1000 ug via INTRAMUSCULAR

## 2022-09-25 NOTE — Progress Notes (Signed)
Subjective:  Patient ID: Cynthia Simpson, female    DOB: May 27, 1945  Age: 77 y.o. MRN: [id number]  CC: Hypertension and Hyperlipidemia   HPI Cynthia Simpson presents for f/up ---  Discussed the use of AI scribe software for clinical note transcription with the patient, who gave verbal consent to proceed.  History of Present Illness   The patient, known to have a history of hypertension, presents for a routine follow-up visit. They report feeling "perfect" with no complaints of aches or pains. They maintain an active lifestyle, walking approximately seven miles daily, even in hot weather. They deny any symptoms of exertional chest pain, shortness of breath, dizziness, or lightheadedness. They also deny any symptoms of lower extremity edema.  The patient lives in a two-story residence and reports no difficulty or discomfort with frequent stair climbing. They have been monitoring their heart rate and oxygen saturation at home, reporting a heart rate in the low 60s and oxygen saturation levels of 95-96%. They deny any episodes of irregular heartbeats, except for when they visit the clinic, attributing this to anxiety.  Overall, the patient appears to be managing their hypertension well, with no reported symptoms suggestive of cardiac or renal complications. They express satisfaction with their current state of health and physical capabilities.       Outpatient Medications Prior to Visit  Medication Sig Dispense Refill   Cholecalciferol (VITAMIN D PO) Take 2 capsules by mouth daily.     Omega-3 Fatty Acids (OMEGA 3 PO) Take 2 capsules by mouth daily.     rosuvastatin (CRESTOR) 10 MG tablet Take 1 tablet (10 mg total) by mouth daily. 90 tablet 1   nebivolol (BYSTOLIC) 5 MG tablet Take 1 tablet (5 mg total) by mouth daily. 90 tablet 0   gabapentin (NEURONTIN) 100 MG capsule Take 2 capsules (200 mg total) by mouth 3 (three) times daily. 540 capsule 0   No facility-administered medications  prior to visit.    ROS Review of Systems  Constitutional: Negative.  Negative for diaphoresis, fatigue and unexpected weight change.  HENT: Negative.    Eyes: Negative.   Respiratory: Negative.  Negative for cough, chest tightness, shortness of breath and wheezing.   Cardiovascular:  Negative for chest pain, palpitations and leg swelling.  Gastrointestinal:  Negative for abdominal pain, constipation, nausea and vomiting.  Genitourinary: Negative.  Negative for difficulty urinating.  Musculoskeletal: Negative.  Negative for arthralgias, gait problem and joint swelling.  Skin: Negative.   Neurological: Negative.  Negative for dizziness, weakness, light-headedness and numbness.  Hematological:  Negative for adenopathy. Does not bruise/bleed easily.  Psychiatric/Behavioral: Negative.      Objective:  BP (!) 148/84 (BP Location: Left Arm, Patient Position: Sitting, Cuff Size: Large)   Pulse 97   Temp 98.4 F (36.9 C) (Oral)   Resp 16   Ht 5\' 6"  (1.676 m)   Wt 135 lb (61.2 kg)   SpO2 99%   BMI 21.79 kg/m   BP Readings from Last 3 Encounters:  09/25/22 (!) 148/84  10/01/21 140/86  08/10/20 (!) 144/86    Wt Readings from Last 3 Encounters:  09/25/22 135 lb (61.2 kg)  10/01/21 133 lb (60.3 kg)  08/10/20 128 lb (58.1 kg)    Physical Exam Vitals reviewed.  Constitutional:      Appearance: Normal appearance.  HENT:     Mouth/Throat:     Mouth: Mucous membranes are moist.  Eyes:     General: No scleral icterus.  Conjunctiva/sclera: Conjunctivae normal.  Cardiovascular:     Rate and Rhythm: Normal rate and regular rhythm.     Heart sounds: No murmur heard.    Comments: EKG- NSR, 74 bpm NS T wave changes No LVH or Q waves unchanged Pulmonary:     Effort: Pulmonary effort is normal.     Breath sounds: No stridor. No wheezing, rhonchi or rales.  Abdominal:     General: Abdomen is flat.     Palpations: There is no mass.     Tenderness: There is no abdominal  tenderness. There is no guarding or rebound.     Hernia: No hernia is present.  Musculoskeletal:        General: Normal range of motion.     Cervical back: Neck supple.     Right lower leg: No edema.     Left lower leg: No edema.  Lymphadenopathy:     Cervical: No cervical adenopathy.  Skin:    General: Skin is warm and dry.     Findings: No rash.  Neurological:     General: No focal deficit present.  Psychiatric:        Mood and Affect: Mood normal.        Behavior: Behavior normal.     Lab Results  Component Value Date   WBC 6.1 10/01/2021   HGB 13.5 10/01/2021   HCT 39.7 10/01/2021   PLT 257.0 10/01/2021   GLUCOSE 101 (H) 10/01/2021   CHOL 217 (H) 10/01/2021   TRIG 107.0 10/01/2021   HDL 70.40 10/01/2021   LDLCALC 125 (H) 10/01/2021   ALT 14 10/01/2021   AST 21 10/01/2021   NA 135 10/01/2021   K 4.5 10/01/2021   CL 102 10/01/2021   CREATININE 0.82 10/01/2021   BUN 21 10/01/2021   CO2 23 10/01/2021   TSH 3.00 10/01/2021   HGBA1C 5.3 06/22/2020    VAS [place] ABI WITH/WO TBI  Result Date: 05/28/2019 LOWER EXTREMITY DOPPLER STUDY Indications: Bilateral leg pain . High Risk Factors: Hyperlipidemia. Other Factors: History of walking 5 miles. Redish toes bilaterally.  Comparison Study: No prior exam Performing Technologist: Elita Quick RVT  Examination Guidelines: A complete evaluation includes at minimum, Doppler waveform signals and systolic blood pressure reading at the level of bilateral brachial, anterior tibial, and posterior tibial arteries, when vessel segments are accessible. Bilateral testing is considered an integral part of a complete examination. Photoelectric Plethysmograph (PPG) waveforms and toe systolic pressure readings are included as required and additional duplex testing as needed. Limited examinations for reoccurring indications may be performed as noted.  ABI Findings: +---------+------------------+-----+--------+--------+ Right    Rt Pressure  (mmHg)IndexWaveformComment  +---------+------------------+-----+--------+--------+ Brachial 158                                     +---------+------------------+-----+--------+--------+ PTA      175               1.11 biphasic         +---------+------------------+-----+--------+--------+ DP       165               1.04 biphasic         +---------+------------------+-----+--------+--------+ Great Toe124               0.78 Normal           +---------+------------------+-----+--------+--------+ +---------+------------------+-----+--------+-------+ Left     Lt Pressure (mmHg)IndexWaveformComment +---------+------------------+-----+--------+-------+  Brachial 153                                    +---------+------------------+-----+--------+-------+ PTA      176               1.11 biphasic        +---------+------------------+-----+--------+-------+ DP       155               0.98 biphasic        +---------+------------------+-----+--------+-------+ Great Toe110               0.70 Normal          +---------+------------------+-----+--------+-------+ +-------+-----------+-----------+------------+------------+ ABI/TBIToday's ABIToday's TBIPrevious ABIPrevious TBI +-------+-----------+-----------+------------+------------+ Right  1.11       0.78                                +-------+-----------+-----------+------------+------------+ Left   1.11       0.70                                +-------+-----------+-----------+------------+------------+  Summary: Right: Resting right ankle-brachial index is within normal range. No evidence of significant right lower extremity arterial disease. The right toe-brachial index is normal. Left: Resting left ankle-brachial index is within normal range. No evidence of significant left lower extremity arterial disease. The left toe-brachial index is normal.  *See table(s) above for measurements and observations.   Electronically signed by Lemar Livings MD on 05/28/2019 at 10:58:43 AM.    Final     Assessment & Plan:  B12 deficiency -     Cyanocobalamin  Essential hypertension- Her BP is well controled -     Basic metabolic panel; Future -     TSH; Future -     Urinalysis, Routine w reflex microscopic; Future -     Hepatic function panel; Future -     EKG 12-Lead -     Nebivolol HCl; Take 1 tablet (5 mg total) by mouth daily.  Dispense: 90 tablet; Refill: 1  Hyperlipidemia LDL goal <130 - LDL goal achieved. Doing well on the statin  -     Lipid panel; Future -     TSH; Future -     Hepatic function panel; Future  Stage 3a chronic kidney disease (HCC) -     Basic metabolic panel; Future -     Urinalysis, Routine w reflex microscopic; Future  Vitamin B12 deficiency neuropathy (HCC) -     CBC with Differential/Platelet; Future -     Folate; Future  Tachycardia- HR is normal now. -     Nebivolol HCl; Take 1 tablet (5 mg total) by mouth daily.  Dispense: 90 tablet; Refill: 1 -     Gabapentin; Take 2 capsules (200 mg total) by mouth 3 (three) times daily.  Dispense: 540 capsule; Refill: 1  Erythromelalgia, traumatic (HCC) -     Gabapentin; Take 2 capsules (200 mg total) by mouth 3 (three) times daily.  Dispense: 540 capsule; Refill: 1     Follow-up: Return in about 6 months (around 03/28/2023).  Sanda Linger, MD

## 2022-09-25 NOTE — Patient Instructions (Signed)
Hypertension, Adult High blood pressure (hypertension) is when the force of blood pumping through the arteries is too strong. The arteries are the blood vessels that carry blood from the heart throughout the body. Hypertension forces the heart to work harder to pump blood and may cause arteries to become narrow or stiff. Untreated or uncontrolled hypertension can lead to a heart attack, heart failure, a stroke, kidney disease, and other problems. A blood pressure reading consists of a higher number over a lower number. Ideally, your blood pressure should be below 120/80. The first ("top") number is called the systolic pressure. It is a measure of the pressure in your arteries as your heart beats. The second ("bottom") number is called the diastolic pressure. It is a measure of the pressure in your arteries as the heart relaxes. What are the causes? The exact cause of this condition is not known. There are some conditions that result in high blood pressure. What increases the risk? Certain factors may make you more likely to develop high blood pressure. Some of these risk factors are under your control, including: Smoking. Not getting enough exercise or physical activity. Being overweight. Having too much fat, sugar, calories, or salt (sodium) in your diet. Drinking too much alcohol. Other risk factors include: Having a personal history of heart disease, diabetes, high cholesterol, or kidney disease. Stress. Having a family history of high blood pressure and high cholesterol. Having obstructive sleep apnea. Age. The risk increases with age. What are the signs or symptoms? High blood pressure may not cause symptoms. Very high blood pressure (hypertensive crisis) may cause: Headache. Fast or irregular heartbeats (palpitations). Shortness of breath. Nosebleed. Nausea and vomiting. Vision changes. Severe chest pain, dizziness, and seizures. How is this diagnosed? This condition is diagnosed by  measuring your blood pressure while you are seated, with your arm resting on a flat surface, your legs uncrossed, and your feet flat on the floor. The cuff of the blood pressure monitor will be placed directly against the skin of your upper arm at the level of your heart. Blood pressure should be measured at least twice using the same arm. Certain conditions can cause a difference in blood pressure between your right and left arms. If you have a high blood pressure reading during one visit or you have normal blood pressure with other risk factors, you may be asked to: Return on a different day to have your blood pressure checked again. Monitor your blood pressure at home for 1 week or longer. If you are diagnosed with hypertension, you may have other blood or imaging tests to help your health care provider understand your overall risk for other conditions. How is this treated? This condition is treated by making healthy lifestyle changes, such as eating healthy foods, exercising more, and reducing your alcohol intake. You may be referred for counseling on a healthy diet and physical activity. Your health care provider may prescribe medicine if lifestyle changes are not enough to get your blood pressure under control and if: Your systolic blood pressure is above 130. Your diastolic blood pressure is above 80. Your personal target blood pressure may vary depending on your medical conditions, your age, and other factors. Follow these instructions at home: Eating and drinking  Eat a diet that is high in fiber and potassium, and low in sodium, added sugar, and fat. An example of this eating plan is called the DASH diet. DASH stands for Dietary Approaches to Stop Hypertension. To eat this way: Eat   plenty of fresh fruits and vegetables. Try to fill one half of your plate at each meal with fruits and vegetables. Eat whole grains, such as whole-wheat pasta, brown rice, or whole-grain bread. Fill about one  fourth of your plate with whole grains. Eat or drink low-fat dairy products, such as skim milk or low-fat yogurt. Avoid fatty cuts of meat, processed or cured meats, and poultry with skin. Fill about one fourth of your plate with lean proteins, such as fish, chicken without skin, beans, eggs, or tofu. Avoid pre-made and processed foods. These tend to be higher in sodium, added sugar, and fat. Reduce your daily sodium intake. Many people with hypertension should eat less than 1,500 mg of sodium a day. Do not drink alcohol if: Your health care provider tells you not to drink. You are pregnant, may be pregnant, or are planning to become pregnant. If you drink alcohol: Limit how much you have to: 0-1 drink a day for women. 0-2 drinks a day for men. Know how much alcohol is in your drink. In the [place] one drink equals one 12 oz bottle of beer (355 mL), one 5 oz glass of wine (148 mL), or one 1 oz glass of hard liquor (44 mL). Lifestyle  Work with your health care provider to maintain a healthy body weight or to lose weight. Ask what an ideal weight is for you. Get at least 30 minutes of exercise that causes your heart to beat faster (aerobic exercise) most days of the week. Activities may include walking, swimming, or biking. Include exercise to strengthen your muscles (resistance exercise), such as Pilates or lifting weights, as part of your weekly exercise routine. Try to do these types of exercises for 30 minutes at least 3 days a week. Do not use any products that contain nicotine or tobacco. These products include cigarettes, chewing tobacco, and vaping devices, such as e-cigarettes. If you need help quitting, ask your health care provider. Monitor your blood pressure at home as told by your health care provider. Keep all follow-up visits. This is important. Medicines Take over-the-counter and prescription medicines only as told by your health care provider. Follow directions carefully. Blood  pressure medicines must be taken as prescribed. Do not skip doses of blood pressure medicine. Doing this puts you at risk for problems and can make the medicine less effective. Ask your health care provider about side effects or reactions to medicines that you should watch for. Contact a health care provider if you: Think you are having a reaction to a medicine you are taking. Have headaches that keep coming back (recurring). Feel dizzy. Have swelling in your ankles. Have trouble with your vision. Get help right away if you: Develop a severe headache or confusion. Have unusual weakness or numbness. Feel faint. Have severe pain in your chest or abdomen. Vomit repeatedly. Have trouble breathing. These symptoms may be an emergency. Get help right away. Call 911. Do not wait to see if the symptoms will go away. Do not drive yourself to the hospital. Summary Hypertension is when the force of blood pumping through your arteries is too strong. If this condition is not controlled, it may put you at risk for serious complications. Your personal target blood pressure may vary depending on your medical conditions, your age, and other factors. For most people, a normal blood pressure is less than 120/80. Hypertension is treated with lifestyle changes, medicines, or a combination of both. Lifestyle changes include losing weight, eating a healthy,   low-sodium diet, exercising more, and limiting alcohol. This information is not intended to replace advice given to you by your health care provider. Make sure you discuss any questions you have with your health care provider. Document Revised: 01/02/2021 Document Reviewed: 01/02/2021 Elsevier Patient Education  2024 Elsevier Inc.  

## 2022-09-27 ENCOUNTER — Encounter: Payer: Self-pay | Admitting: Internal Medicine

## 2022-10-09 ENCOUNTER — Encounter (INDEPENDENT_AMBULATORY_CARE_PROVIDER_SITE_OTHER): Payer: Self-pay

## 2022-10-10 ENCOUNTER — Ambulatory Visit: Payer: Medicare Other | Admitting: Internal Medicine

## 2022-10-28 ENCOUNTER — Ambulatory Visit (INDEPENDENT_AMBULATORY_CARE_PROVIDER_SITE_OTHER): Payer: Medicare Other

## 2022-10-28 ENCOUNTER — Telehealth: Payer: Self-pay

## 2022-10-28 DIAGNOSIS — E538 Deficiency of other specified B group vitamins: Secondary | ICD-10-CM | POA: Diagnosis not present

## 2022-10-28 MED ORDER — CYANOCOBALAMIN 1000 MCG/ML IJ SOLN
1000.0000 ug | Freq: Once | INTRAMUSCULAR | Status: AC
Start: 2022-10-28 — End: 2022-10-28
  Administered 2022-10-28: 1000 ug via INTRAMUSCULAR

## 2022-10-28 NOTE — Progress Notes (Signed)
After obtaining consent, and per orders of Dr. Yetta Barre, injection of B12 was given by Lorelle Gibbs, RN. Patient tolerated well instructed to report any adverse reaction to me immediately.

## 2022-10-28 NOTE — Telephone Encounter (Signed)
In Sept

## 2022-10-28 NOTE — Telephone Encounter (Signed)
Patient would like to know when best time to get next COVID booster. She got her last one in the March 2024. Patient has schedule trips in September and October 2024 and would like to before if recommended.

## 2022-12-02 ENCOUNTER — Ambulatory Visit (INDEPENDENT_AMBULATORY_CARE_PROVIDER_SITE_OTHER): Payer: Medicare Other

## 2022-12-02 DIAGNOSIS — E538 Deficiency of other specified B group vitamins: Secondary | ICD-10-CM

## 2022-12-02 MED ORDER — CYANOCOBALAMIN 1000 MCG/ML IJ SOLN
1000.0000 ug | Freq: Once | INTRAMUSCULAR | Status: AC
Start: 2022-12-02 — End: 2022-12-02
  Administered 2022-12-02: 1000 ug via INTRAMUSCULAR

## 2022-12-02 NOTE — Progress Notes (Signed)
Pt here for monthly B12 injection per Dr.Jones  B12 given IM and pt tolerated injection well.  Next B12 injection scheduled for 10/23  Patient advised to report to the office immediately if she noticed any adverse reaction.

## 2023-01-01 ENCOUNTER — Ambulatory Visit (INDEPENDENT_AMBULATORY_CARE_PROVIDER_SITE_OTHER): Payer: Medicare Other | Admitting: Radiology

## 2023-01-01 DIAGNOSIS — E538 Deficiency of other specified B group vitamins: Secondary | ICD-10-CM | POA: Diagnosis not present

## 2023-01-01 MED ORDER — CYANOCOBALAMIN 1000 MCG/ML IJ SOLN
1000.0000 ug | Freq: Once | INTRAMUSCULAR | Status: AC
Start: 2023-01-01 — End: 2023-01-01
  Administered 2023-01-01: 1000 ug via INTRAMUSCULAR

## 2023-01-01 NOTE — Progress Notes (Signed)
Patient here for monthly B12 injection. Patient tolerated well with no complications.

## 2023-02-03 ENCOUNTER — Ambulatory Visit (INDEPENDENT_AMBULATORY_CARE_PROVIDER_SITE_OTHER): Payer: Medicare Other

## 2023-02-03 DIAGNOSIS — E538 Deficiency of other specified B group vitamins: Secondary | ICD-10-CM | POA: Diagnosis not present

## 2023-02-03 MED ORDER — CYANOCOBALAMIN 1000 MCG/ML IJ SOLN
1000.0000 ug | Freq: Once | INTRAMUSCULAR | Status: AC
Start: 2023-02-03 — End: 2023-02-03
  Administered 2023-02-03: 1000 ug via INTRAMUSCULAR

## 2023-02-03 NOTE — Progress Notes (Addendum)
PT visits today for their B-12 injection. PT informed of what they were receiving and tolerated injection well. PT informed to reach out to office if needed.

## 2023-03-03 ENCOUNTER — Ambulatory Visit (INDEPENDENT_AMBULATORY_CARE_PROVIDER_SITE_OTHER): Payer: Medicare Other

## 2023-03-03 DIAGNOSIS — E538 Deficiency of other specified B group vitamins: Secondary | ICD-10-CM

## 2023-03-03 MED ORDER — CYANOCOBALAMIN 1000 MCG/ML IJ SOLN
1000.0000 ug | Freq: Once | INTRAMUSCULAR | Status: AC
  Administered 2023-03-03: 1000 ug via INTRAMUSCULAR

## 2023-03-03 NOTE — Progress Notes (Signed)
After obtaining consent, and per orders of Dr. Ronnald Ramp, injection of B12 given by Marrian Salvage. Patient instructed to report any adverse reaction to me immediately.

## 2023-03-12 ENCOUNTER — Other Ambulatory Visit: Payer: Self-pay | Admitting: Internal Medicine

## 2023-03-12 DIAGNOSIS — I10 Essential (primary) hypertension: Secondary | ICD-10-CM

## 2023-03-12 DIAGNOSIS — R000 Tachycardia, unspecified: Secondary | ICD-10-CM

## 2023-03-18 ENCOUNTER — Telehealth: Payer: Self-pay | Admitting: Internal Medicine

## 2023-03-18 NOTE — Telephone Encounter (Signed)
 Letter has been received and given to Dr. Yetta Barre

## 2023-03-18 NOTE — Telephone Encounter (Signed)
 Patient dropped off a letter for provider, will be in providers box up front.

## 2023-03-21 ENCOUNTER — Other Ambulatory Visit: Payer: Self-pay | Admitting: Internal Medicine

## 2023-03-21 DIAGNOSIS — I10 Essential (primary) hypertension: Secondary | ICD-10-CM

## 2023-03-21 DIAGNOSIS — R000 Tachycardia, unspecified: Secondary | ICD-10-CM

## 2023-03-21 NOTE — Telephone Encounter (Signed)
 Copied from CRM [id number]. Topic: Clinical - Medication Refill >> Mar 21, 2023  1:28 PM Isabell A wrote: Most Recent Primary Care Visit:  Provider: LEAR, GRACE P  Department: [facility]  Visit Type: NURSE VISIT  Date: 03/03/2023  Medication: ***  Has the patient contacted their pharmacy?  (Agent: If no, request that the patient contact the pharmacy for the refill. If patient does not wish to contact the pharmacy document the reason why and proceed with request.) (Agent: If yes, when and what did the pharmacy advise?)  Is this the correct pharmacy for this prescription?  If no, delete pharmacy and type the correct one.  This is the patient's preferred pharmacy:  [id number] - [place], [place] - [address] AT [facility] OF [address] [place] [address] Phone: [phone] Fax: [phone]   Has the prescription been filled recently?   Is the patient out of the medication?   Has the patient been seen for an appointment in the last year OR does the patient have an upcoming appointment?   Can we respond through MyChart?   Agent: Please be advised that Rx refills may take up to 3 business days. We ask that you follow-up with your pharmacy.

## 2023-03-24 MED ORDER — NEBIVOLOL HCL 5 MG PO TABS: 5.0000 mg | ORAL_TABLET | Freq: Every day | ORAL | 1 refills | Status: DC

## 2023-03-27 ENCOUNTER — Other Ambulatory Visit: Payer: Self-pay | Admitting: Internal Medicine

## 2023-03-27 DIAGNOSIS — I7381 Erythromelalgia: Secondary | ICD-10-CM

## 2023-03-27 DIAGNOSIS — R000 Tachycardia, unspecified: Secondary | ICD-10-CM

## 2023-04-04 ENCOUNTER — Ambulatory Visit (INDEPENDENT_AMBULATORY_CARE_PROVIDER_SITE_OTHER): Payer: Medicare Other

## 2023-04-04 DIAGNOSIS — E538 Deficiency of other specified B group vitamins: Secondary | ICD-10-CM

## 2023-04-04 MED ORDER — CYANOCOBALAMIN 1000 MCG/ML IJ SOLN
1000.0000 ug | Freq: Once | INTRAMUSCULAR | Status: AC
  Administered 2023-04-04: 1000 ug via INTRAMUSCULAR

## 2023-04-04 NOTE — Progress Notes (Signed)
After obtaining consent, and per orders of Dr. Yetta Barre, injection of B12 given by Ferdie Ping. Patient instructed to report any adverse reaction to me immediately.

## 2023-05-07 ENCOUNTER — Ambulatory Visit: Payer: Medicare Other

## 2023-05-08 ENCOUNTER — Ambulatory Visit (INDEPENDENT_AMBULATORY_CARE_PROVIDER_SITE_OTHER): Payer: Medicare Other

## 2023-05-08 DIAGNOSIS — E538 Deficiency of other specified B group vitamins: Secondary | ICD-10-CM | POA: Diagnosis not present

## 2023-05-08 MED ORDER — CYANOCOBALAMIN 1000 MCG/ML IJ SOLN
1000.0000 ug | Freq: Once | INTRAMUSCULAR | Status: AC
Start: 2023-05-08 — End: 2023-05-08
  Administered 2023-05-08: 1000 ug via INTRAMUSCULAR

## 2023-05-08 NOTE — Progress Notes (Signed)
 Patient visits today for their b-12 injection. Patient tolerated injection well and was informed to reach out to the office if needed.

## 2023-05-26 ENCOUNTER — Telehealth: Payer: Self-pay | Admitting: Internal Medicine

## 2023-05-26 NOTE — Telephone Encounter (Unsigned)
 Copied from CRM [id number]. Topic: Clinical - Medical Advice >> May 26, 2023 11:02 AM Sim Boast F wrote: Reason for CRM: Patient had messel when she was a child but never had the messels vaccine. She'll be doing some travel internationally and wants to know if it's okay to get the vaccine?

## 2023-05-27 NOTE — Telephone Encounter (Signed)
 Yes. I believe [facility] is offering them.

## 2023-05-27 NOTE — Telephone Encounter (Signed)
 Patient has been made aware. She gave a verbal understanding.

## 2023-05-27 NOTE — Telephone Encounter (Signed)
 Is this okay?

## 2023-06-06 ENCOUNTER — Ambulatory Visit: Payer: Medicare Other

## 2023-06-06 DIAGNOSIS — E538 Deficiency of other specified B group vitamins: Secondary | ICD-10-CM

## 2023-06-06 MED ORDER — CYANOCOBALAMIN 1000 MCG/ML IJ SOLN
1000.0000 ug | Freq: Once | INTRAMUSCULAR | Status: AC
Start: 2023-06-06 — End: 2023-06-06
  Administered 2023-06-06: 1000 ug via INTRAMUSCULAR

## 2023-06-06 NOTE — Progress Notes (Signed)
 Patient visits today to receive her B12 injection/vaccine. Patient was informed and tolerated well. Patient was notified to reach out to [place] if needed.

## 2023-06-19 ENCOUNTER — Other Ambulatory Visit: Payer: Self-pay | Admitting: Internal Medicine

## 2023-06-19 DIAGNOSIS — R000 Tachycardia, unspecified: Secondary | ICD-10-CM

## 2023-06-19 DIAGNOSIS — I10 Essential (primary) hypertension: Secondary | ICD-10-CM

## 2023-07-07 ENCOUNTER — Ambulatory Visit: Payer: Medicare Other

## 2023-07-07 ENCOUNTER — Ambulatory Visit (INDEPENDENT_AMBULATORY_CARE_PROVIDER_SITE_OTHER): Payer: Medicare Other

## 2023-07-07 DIAGNOSIS — E538 Deficiency of other specified B group vitamins: Secondary | ICD-10-CM | POA: Diagnosis not present

## 2023-07-07 MED ORDER — CYANOCOBALAMIN 1000 MCG/ML IJ SOLN
1000.0000 ug | Freq: Once | INTRAMUSCULAR | Status: AC
  Administered 2023-07-07: 1000 ug via INTRAMUSCULAR

## 2023-07-07 NOTE — Progress Notes (Signed)
Pt here for monthly B12 injection per Dr. Yetta Barre  B12 given IM. and pt tolerated injection well.

## 2023-08-08 ENCOUNTER — Ambulatory Visit (INDEPENDENT_AMBULATORY_CARE_PROVIDER_SITE_OTHER)

## 2023-08-08 DIAGNOSIS — E538 Deficiency of other specified B group vitamins: Secondary | ICD-10-CM | POA: Diagnosis not present

## 2023-08-08 MED ORDER — CYANOCOBALAMIN 1000 MCG/ML IJ SOLN
1000.0000 ug | Freq: Once | INTRAMUSCULAR | Status: AC
Start: 2023-08-08 — End: 2023-08-08
  Administered 2023-08-08: 1000 ug via INTRAMUSCULAR

## 2023-08-08 NOTE — Progress Notes (Signed)
 Pt here for monthly B12 injection per Dr. Rochelle Chu  B12 1000mcg given IM and pt tolerated injection well.  Patient has scheduled her Next B12 Injection for July.

## 2023-09-10 ENCOUNTER — Ambulatory Visit

## 2023-09-10 DIAGNOSIS — E538 Deficiency of other specified B group vitamins: Secondary | ICD-10-CM | POA: Diagnosis not present

## 2023-09-10 MED ORDER — CYANOCOBALAMIN 1000 MCG/ML IJ SOLN
1000.0000 ug | Freq: Once | INTRAMUSCULAR | Status: AC
  Administered 2023-09-10: 1000 ug via INTRAMUSCULAR

## 2023-09-10 NOTE — Progress Notes (Signed)
 After obtaining consent, and per orders of Dr. Yetta Barre, injection of B12 given by Ferdie Ping. Patient instructed to report any adverse reaction to me immediately.

## 2023-09-16 ENCOUNTER — Other Ambulatory Visit: Payer: Self-pay | Admitting: Internal Medicine

## 2023-09-16 DIAGNOSIS — I10 Essential (primary) hypertension: Secondary | ICD-10-CM

## 2023-09-16 DIAGNOSIS — R000 Tachycardia, unspecified: Secondary | ICD-10-CM

## 2023-09-16 NOTE — Telephone Encounter (Signed)
 Copied from CRM [id number]. Topic: Clinical - Medication Refill >> Sep 16, 2023  9:58 AM Aleatha C wrote: Medication: nebivolol  (BYSTOLIC ) 5 MG tablet  Has the patient contacted their pharmacy? No (Agent: If no, request that the patient contact the pharmacy for the refill. If patient does not wish to contact the pharmacy document the reason why and proceed with request.) (Agent: If yes, when and what did the pharmacy advise?)  This is the patient's preferred pharmacy:  [id number] - [place], [place] - [address] AT [facility] OF [address] [place]  [id number] Phone: [phone] Fax: [phone]  Is this the correct pharmacy for this prescription? Yes If no, delete pharmacy and type the correct one.   Has the prescription been filled recently? No  Is the patient out of the medication? No, 6 left   Has the patient been seen for an appointment in the last year OR does the patient have an upcoming appointment? Yes  Can we respond through MyChart? No  Agent: Please be advised that Rx refills may take up to 3 business days. We ask that you follow-up with your pharmacy.

## 2023-09-17 MED ORDER — NEBIVOLOL HCL 5 MG PO TABS: 5.0000 mg | ORAL_TABLET | Freq: Every day | ORAL | 0 refills | Status: DC

## 2023-09-26 ENCOUNTER — Other Ambulatory Visit: Payer: Self-pay | Admitting: Internal Medicine

## 2023-09-26 DIAGNOSIS — I7381 Erythromelalgia: Secondary | ICD-10-CM

## 2023-09-26 DIAGNOSIS — R000 Tachycardia, unspecified: Secondary | ICD-10-CM

## 2023-09-30 ENCOUNTER — Telehealth: Payer: Self-pay

## 2023-09-30 ENCOUNTER — Ambulatory Visit: Admitting: Internal Medicine

## 2023-09-30 ENCOUNTER — Encounter: Payer: Self-pay | Admitting: Internal Medicine

## 2023-09-30 VITALS — BP 136/82 | HR 73 | Temp 97.8°F | Resp 16 | Ht 66.0 in | Wt 135.2 lb

## 2023-09-30 DIAGNOSIS — Z0000 Encounter for general adult medical examination without abnormal findings: Secondary | ICD-10-CM

## 2023-09-30 DIAGNOSIS — I10 Essential (primary) hypertension: Secondary | ICD-10-CM | POA: Diagnosis not present

## 2023-09-30 DIAGNOSIS — I739 Peripheral vascular disease, unspecified: Secondary | ICD-10-CM | POA: Insufficient documentation

## 2023-09-30 DIAGNOSIS — Z0001 Encounter for general adult medical examination with abnormal findings: Secondary | ICD-10-CM

## 2023-09-30 DIAGNOSIS — G63 Polyneuropathy in diseases classified elsewhere: Secondary | ICD-10-CM

## 2023-09-30 DIAGNOSIS — E538 Deficiency of other specified B group vitamins: Secondary | ICD-10-CM | POA: Diagnosis not present

## 2023-09-30 DIAGNOSIS — R000 Tachycardia, unspecified: Secondary | ICD-10-CM | POA: Diagnosis not present

## 2023-09-30 DIAGNOSIS — E785 Hyperlipidemia, unspecified: Secondary | ICD-10-CM | POA: Diagnosis not present

## 2023-09-30 MED ORDER — NEBIVOLOL HCL 5 MG PO TABS: 5.0000 mg | ORAL_TABLET | Freq: Every day | ORAL | 1 refills | Status: DC

## 2023-09-30 MED ORDER — ROSUVASTATIN CALCIUM 10 MG PO TABS: 10.0000 mg | ORAL_TABLET | Freq: Every day | ORAL | 1 refills | Status: DC

## 2023-09-30 NOTE — Patient Instructions (Signed)

## 2023-09-30 NOTE — Progress Notes (Signed)
 Subjective:  Patient ID: Cynthia Simpson, female    DOB: Sep 30, 1945  Age: 78 y.o. MRN: [id number]  CC: Annual Exam, Hypertension, and Hyperlipidemia   HPI BASSHEVA FLURY presents for a CPX and f/up ---  Discussed the use of AI scribe software for clinical note transcription with the patient, who gave verbal consent to proceed.  History of Present Illness Cynthia Simpson is a 78 year old female who presents for a routine follow-up regarding her blood pressure management.  She has been monitoring her blood pressure at home, noting that it has generally been well-controlled, although it was elevated this morning. She typically measures it in the morning after a long walk and a cool down. No chest pain, shortness of breath, dizziness, lightheadedness, or irregular heartbeats during her walks, although she dislikes the heat.  She complains of LE cramps with activity.    Outpatient Medications Prior to Visit  Medication Sig Dispense Refill   Cholecalciferol (VITAMIN D PO) Take 2 capsules by mouth daily.     gabapentin  (NEURONTIN ) 100 MG capsule TAKE 2 CAPSULES(200 MG) BY MOUTH THREE TIMES DAILY 540 capsule 1   Omega-3 Fatty Acids (OMEGA 3 PO) Take 2 capsules by mouth daily.     nebivolol  (BYSTOLIC ) 5 MG tablet Take 1 tablet (5 mg total) by mouth daily. Schedule an appt for further refills 30 tablet 0   rosuvastatin  (CRESTOR ) 10 MG tablet Take 1 tablet (10 mg total) by mouth daily. 90 tablet 1   No facility-administered medications prior to visit.    ROS Review of Systems  Objective:  BP 136/82 (BP Location: Left Arm, Patient Position: Sitting, Cuff Size: Normal)   Pulse 73   Temp 97.8 F (36.6 C) (Oral)   Resp 16   Ht 5' 6 (1.676 m)   Wt 135 lb 3.2 oz (61.3 kg)   SpO2 99%   BMI 21.82 kg/m   BP Readings from Last 3 Encounters:  09/30/23 136/82  09/25/22 (!) 148/84  10/01/21 140/86    Wt Readings from Last 3 Encounters:  09/30/23 135 lb 3.2 oz (61.3 kg)   09/25/22 135 lb (61.2 kg)  10/01/21 133 lb (60.3 kg)    Physical Exam Vitals reviewed.  Constitutional:      Appearance: Normal appearance.  HENT:     Mouth/Throat:     Mouth: Mucous membranes are moist.  Eyes:     General: No scleral icterus.    Conjunctiva/sclera: Conjunctivae normal.  Neck:     Vascular: No carotid bruit.  Cardiovascular:     Rate and Rhythm: Normal rate and regular rhythm.     Pulses:          Dorsalis pedis pulses are 1+ on the right side and 1+ on the left side.       Posterior tibial pulses are 1+ on the right side and 1+ on the left side.     Heart sounds: No murmur heard.    No friction rub. No gallop.     Comments: EKG--- NSR, 69 bpm No LVH, Q waves, or ST/T wave changes  Pulmonary:     Effort: Pulmonary effort is normal. No respiratory distress.     Breath sounds: No stridor. No wheezing, rhonchi or rales.  Chest:     Chest wall: No tenderness.  Abdominal:     General: Abdomen is flat.     Palpations: There is no mass.     Tenderness: There is no abdominal tenderness. There  is no guarding.     Hernia: No hernia is present.  Musculoskeletal:        General: Normal range of motion.     Cervical back: Neck supple.     Right lower leg: No edema.     Left lower leg: No edema.  Feet:     Right foot:     Skin integrity: Skin integrity normal. No skin breakdown.     Toenail Condition: Right toenails are normal.     Left foot:     Skin integrity: Skin integrity normal. No skin breakdown.     Toenail Condition: Left toenails are normal.  Lymphadenopathy:     Cervical: No cervical adenopathy.  Skin:    General: Skin is warm and dry.     Coloration: Skin is not pale.  Neurological:     General: No focal deficit present.     Mental Status: She is alert. Mental status is at baseline.  Psychiatric:        Mood and Affect: Mood normal.        Behavior: Behavior normal.     Lab Results  Component Value Date   WBC 6.7 09/25/2022   HGB 14.0  09/25/2022   HCT 41.6 09/25/2022   PLT 294.0 09/25/2022   GLUCOSE 111 (H) 09/25/2022   CHOL 242 (H) 09/25/2022   TRIG 97.0 09/25/2022   HDL 67.30 09/25/2022   LDLCALC 155 (H) 09/25/2022   ALT 13 09/25/2022   AST 19 09/25/2022   NA 140 09/25/2022   K 4.5 09/25/2022   CL 104 09/25/2022   CREATININE 0.92 09/25/2022   BUN 16 09/25/2022   CO2 26 09/25/2022   TSH 2.46 09/25/2022   HGBA1C 5.3 06/22/2020    VAS US  ABI WITH/WO TBI Result Date: 05/28/2019 LOWER EXTREMITY DOPPLER STUDY Indications: Bilateral leg pain . High Risk Factors: Hyperlipidemia. Other Factors: History of walking 5 miles. Redish toes bilaterally.  Comparison Study: No prior exam Performing Technologist: Devere Dark RVT  Examination Guidelines: A complete evaluation includes at minimum, Doppler waveform signals and systolic blood pressure reading at the level of bilateral brachial, anterior tibial, and posterior tibial arteries, when vessel segments are accessible. Bilateral testing is considered an integral part of a complete examination. Photoelectric Plethysmograph (PPG) waveforms and toe systolic pressure readings are included as required and additional duplex testing as needed. Limited examinations for reoccurring indications may be performed as noted.  ABI Findings: +---------+------------------+-----+--------+--------+ Right    Rt Pressure (mmHg)IndexWaveformComment  +---------+------------------+-----+--------+--------+ Brachial 158                                     +---------+------------------+-----+--------+--------+ PTA      175               1.11 biphasic         +---------+------------------+-----+--------+--------+ DP       165               1.04 biphasic         +---------+------------------+-----+--------+--------+ Great Toe124               0.78 Normal           +---------+------------------+-----+--------+--------+ +---------+------------------+-----+--------+-------+ Left     Lt  Pressure (mmHg)IndexWaveformComment +---------+------------------+-----+--------+-------+ Brachial 153                                    +---------+------------------+-----+--------+-------+  PTA      176               1.11 biphasic        +---------+------------------+-----+--------+-------+ DP       155               0.98 biphasic        +---------+------------------+-----+--------+-------+ Great Toe110               0.70 Normal          +---------+------------------+-----+--------+-------+ +-------+-----------+-----------+------------+------------+ ABI/TBIToday's ABIToday's TBIPrevious ABIPrevious TBI +-------+-----------+-----------+------------+------------+ Right  1.11       0.78                                +-------+-----------+-----------+------------+------------+ Left   1.11       0.70                                +-------+-----------+-----------+------------+------------+  Summary: Right: Resting right ankle-brachial index is within normal range. No evidence of significant right lower extremity arterial disease. The right toe-brachial index is normal. Left: Resting left ankle-brachial index is within normal range. No evidence of significant left lower extremity arterial disease. The left toe-brachial index is normal.  *See table(s) above for measurements and observations.  Electronically signed by Penne Colorado MD on 05/28/2019 at 10:58:43 AM.    Final     Assessment & Plan:  Vitamin B12 deficiency neuropathy (HCC) -     CBC with Differential/Platelet; Future -     Folate; Future -     Vitamin B12; Future  Essential hypertension -     Basic metabolic panel with GFR; Future -     Hepatic function panel; Future -     Urinalysis, Routine w reflex microscopic; Future -     EKG 12-Lead -     Nebivolol  HCl; Take 1 tablet (5 mg total) by mouth daily.  Dispense: 90 tablet; Refill: 1  Tachycardia -     TSH; Future -     Nebivolol  HCl; Take 1 tablet  (5 mg total) by mouth daily.  Dispense: 90 tablet; Refill: 1  Encounter for general adult medical examination with abnormal findings  Hyperlipidemia LDL goal <130 -     CK; Future     Follow-up: Return in about 6 months (around 04/01/2024).  Debby Molt, MD

## 2023-09-30 NOTE — Telephone Encounter (Signed)
 Copied from CRM [id number]. Topic: General - Other >> Sep 30, 2023 12:47 PM Rosina D wrote: Reason for [id number] called stating she need to get a full renewal of her medication nebivolol  (BYSTOLIC ) 5 MG tablet because they only gave her 2 weeks worth until she was able to see her doctor. Patient had an appointment today and the doctor stated he was going to renew the medication today [phone]

## 2023-10-01 DIAGNOSIS — H5213 Myopia, bilateral: Secondary | ICD-10-CM | POA: Diagnosis not present

## 2023-10-03 ENCOUNTER — Ambulatory Visit: Payer: Self-pay | Admitting: Internal Medicine

## 2023-10-03 LAB — HEPATIC FUNCTION PANEL
ALT: 14 U/L (ref 0–35)
AST: 20 U/L (ref 0–37)
Albumin: 4.1 g/dL (ref 3.5–5.2)
Alkaline Phosphatase: 45 U/L (ref 39–117)
Bilirubin, Direct: 0.1 mg/dL (ref 0.0–0.3)
Total Bilirubin: 0.6 mg/dL (ref 0.2–1.2)
Total Protein: 7.1 g/dL (ref 6.0–8.3)

## 2023-10-03 LAB — CBC WITH DIFFERENTIAL/PLATELET
Basophils Absolute: 0 K/uL (ref 0.0–0.1)
Basophils Relative: 0.7 % (ref 0.0–3.0)
Eosinophils Absolute: 0.3 K/uL (ref 0.0–0.7)
Eosinophils Relative: 4.6 % (ref 0.0–5.0)
HCT: 40.5 % (ref 36.0–46.0)
Hemoglobin: 13.6 g/dL (ref 12.0–15.0)
Lymphocytes Relative: 41.6 % (ref 12.0–46.0)
Lymphs Abs: 2.5 K/uL (ref 0.7–4.0)
MCHC: 33.6 g/dL (ref 30.0–36.0)
MCV: 94.6 fl (ref 78.0–100.0)
Monocytes Absolute: 0.5 K/uL (ref 0.1–1.0)
Monocytes Relative: 9.1 % (ref 3.0–12.0)
Neutro Abs: 2.6 K/uL (ref 1.4–7.7)
Neutrophils Relative %: 44 % (ref 43.0–77.0)
Platelets: 253 K/uL (ref 150.0–400.0)
RBC: 4.28 Mil/uL (ref 3.87–5.11)
RDW: 14.1 % (ref 11.5–15.5)
WBC: 6 K/uL (ref 4.0–10.5)

## 2023-10-03 LAB — BASIC METABOLIC PANEL WITH GFR
BUN: 16 mg/dL (ref 6–23)
CO2: 26 meq/L (ref 19–32)
Calcium: 9.4 mg/dL (ref 8.4–10.5)
Chloride: 105 meq/L (ref 96–112)
Creatinine, Ser: 0.9 mg/dL (ref 0.40–1.20)
GFR: 60.64 mL/min (ref >60.00)
Glucose, Bld: 94 mg/dL (ref 70–99)
Potassium: 4.4 meq/L (ref 3.5–5.1)
Sodium: 140 meq/L (ref 135–145)

## 2023-10-03 LAB — FOLATE: Folate: 11.2 ng/mL (ref >5.9)

## 2023-10-03 LAB — CK: Total CK: 100 U/L (ref 17–177)

## 2023-10-03 LAB — TSH: TSH: 3.03 u[IU]/mL (ref 0.35–5.50)

## 2023-10-03 LAB — VITAMIN B12: Vitamin B-12: 640 pg/mL (ref 211–911)

## 2023-10-03 NOTE — Telephone Encounter (Signed)
 Medication has been refilled.

## 2023-10-06 ENCOUNTER — Other Ambulatory Visit (INDEPENDENT_AMBULATORY_CARE_PROVIDER_SITE_OTHER)

## 2023-10-06 ENCOUNTER — Other Ambulatory Visit: Payer: Self-pay | Admitting: Internal Medicine

## 2023-10-06 DIAGNOSIS — N1831 Chronic kidney disease, stage 3a: Secondary | ICD-10-CM

## 2023-10-06 DIAGNOSIS — I10 Essential (primary) hypertension: Secondary | ICD-10-CM

## 2023-10-06 LAB — URINALYSIS, ROUTINE W REFLEX MICROSCOPIC
Bilirubin Urine: NEGATIVE
Hgb urine dipstick: NEGATIVE
Ketones, ur: NEGATIVE
Leukocytes,Ua: NEGATIVE
Nitrite: NEGATIVE
RBC / HPF: NONE SEEN (ref 0–?)
Specific Gravity, Urine: 1.01 (ref 1.000–1.030)
Total Protein, Urine: NEGATIVE
Urine Glucose: NEGATIVE
Urobilinogen, UA: 0.2 (ref 0.0–1.0)
WBC, UA: NONE SEEN (ref 0–?)
pH: 6.5 (ref 5.0–8.0)

## 2023-10-13 ENCOUNTER — Ambulatory Visit

## 2023-10-15 ENCOUNTER — Ambulatory Visit (INDEPENDENT_AMBULATORY_CARE_PROVIDER_SITE_OTHER)

## 2023-10-15 DIAGNOSIS — E538 Deficiency of other specified B group vitamins: Secondary | ICD-10-CM | POA: Diagnosis not present

## 2023-10-15 MED ORDER — CYANOCOBALAMIN 1000 MCG/ML IJ SOLN
1000.0000 ug | Freq: Once | INTRAMUSCULAR | Status: AC
  Administered 2023-10-15: 1000 ug via INTRAMUSCULAR

## 2023-10-15 NOTE — Progress Notes (Signed)
 After obtaining consent, and per orders of Dr. Yetta Barre, injection of B12 given by Ferdie Ping. Patient instructed to report any adverse reaction to me immediately.

## 2023-11-19 ENCOUNTER — Ambulatory Visit (INDEPENDENT_AMBULATORY_CARE_PROVIDER_SITE_OTHER)

## 2023-11-19 DIAGNOSIS — E538 Deficiency of other specified B group vitamins: Secondary | ICD-10-CM

## 2023-11-19 MED ORDER — CYANOCOBALAMIN 1000 MCG/ML IJ SOLN
1000.0000 ug | Freq: Once | INTRAMUSCULAR | Status: AC
  Administered 2023-11-19: 1000 ug via INTRAMUSCULAR

## 2023-11-19 NOTE — Progress Notes (Signed)
 After obtaining consent, and per orders of Dr. Joshua, injection of B12 given by Ronnald SHAUNNA Palms. Patient instructed to report any adverse reaction to me immediately.

## 2023-12-24 ENCOUNTER — Ambulatory Visit (INDEPENDENT_AMBULATORY_CARE_PROVIDER_SITE_OTHER)

## 2023-12-24 DIAGNOSIS — E538 Deficiency of other specified B group vitamins: Secondary | ICD-10-CM | POA: Diagnosis not present

## 2023-12-24 MED ORDER — CYANOCOBALAMIN 1000 MCG/ML IJ SOLN
1000.0000 ug | Freq: Once | INTRAMUSCULAR | Status: AC
  Administered 2023-12-24: 1000 ug via INTRAMUSCULAR

## 2023-12-24 NOTE — Progress Notes (Signed)
 After obtaining consent, and per orders of Dr. Joshua, injection of B12 given by Edsel CHRISTELLA Kerns. Patient tolerated well.

## 2024-01-17 ENCOUNTER — Other Ambulatory Visit: Payer: Self-pay | Admitting: Internal Medicine

## 2024-01-17 DIAGNOSIS — I10 Essential (primary) hypertension: Secondary | ICD-10-CM

## 2024-01-17 DIAGNOSIS — R000 Tachycardia, unspecified: Secondary | ICD-10-CM

## 2024-01-28 ENCOUNTER — Ambulatory Visit

## 2024-01-28 DIAGNOSIS — E538 Deficiency of other specified B group vitamins: Secondary | ICD-10-CM | POA: Diagnosis not present

## 2024-01-28 MED ORDER — CYANOCOBALAMIN 1000 MCG/ML IJ SOLN
1000.0000 ug | Freq: Once | INTRAMUSCULAR | Status: AC
  Administered 2024-01-28: 1000 ug via INTRAMUSCULAR

## 2024-01-28 NOTE — Progress Notes (Signed)
 After obtaining consent, and per orders of Dr. Joshua, injection of B12 given by Ronnald SHAUNNA Palms. Patient instructed to report any adverse reaction to me immediately.

## 2024-03-01 ENCOUNTER — Ambulatory Visit

## 2024-03-01 DIAGNOSIS — E538 Deficiency of other specified B group vitamins: Secondary | ICD-10-CM | POA: Diagnosis not present

## 2024-03-01 DIAGNOSIS — G63 Polyneuropathy in diseases classified elsewhere: Secondary | ICD-10-CM

## 2024-03-01 MED ORDER — CYANOCOBALAMIN 1000 MCG/ML IJ SOLN
1000.0000 ug | Freq: Once | INTRAMUSCULAR | Status: AC
  Administered 2024-03-01: 1000 ug via INTRAMUSCULAR

## 2024-03-01 NOTE — Progress Notes (Signed)
 Pt was given B12 injection w/o any complications at this time.

## 2024-04-03 ENCOUNTER — Other Ambulatory Visit: Payer: Self-pay | Admitting: Internal Medicine

## 2024-04-03 DIAGNOSIS — I10 Essential (primary) hypertension: Secondary | ICD-10-CM

## 2024-04-03 DIAGNOSIS — R000 Tachycardia, unspecified: Secondary | ICD-10-CM

## 2024-04-05 ENCOUNTER — Ambulatory Visit

## 2024-04-07 ENCOUNTER — Ambulatory Visit

## 2024-04-08 ENCOUNTER — Ambulatory Visit

## 2024-04-08 DIAGNOSIS — E538 Deficiency of other specified B group vitamins: Secondary | ICD-10-CM | POA: Diagnosis not present

## 2024-04-08 MED ORDER — CYANOCOBALAMIN 1000 MCG/ML IJ SOLN
1000.0000 ug | Freq: Once | INTRAMUSCULAR | Status: AC
  Administered 2024-04-08: 1000 ug via INTRAMUSCULAR

## 2024-04-08 NOTE — Progress Notes (Signed)
 Patient visits today for their b-12 injection. Patient informed of what they had received and tolerated the injection well. Patient advised to reach out to the office if needed.

## 2024-05-10 ENCOUNTER — Ambulatory Visit
